# Patient Record
Sex: Male | Born: 1970 | Race: Black or African American | Hispanic: No | Marital: Single | Smoking: Current every day smoker
Health system: Southern US, Community
[De-identification: ages and names within clinical notes are randomized; demographics above are authoritative.]

## PROBLEM LIST (undated history)

## (undated) ENCOUNTER — Ambulatory Visit (HOSPITAL_COMMUNITY): Discharge status: Absent without leave | Payer: Self-pay

## (undated) DIAGNOSIS — I10 Essential (primary) hypertension: Secondary | ICD-10-CM

## (undated) HISTORY — PX: FRACTURE SURGERY: SHX138

---

## 2004-04-14 HISTORY — PX: FRACTURE SURGERY: SHX138

## 2015-08-24 ENCOUNTER — Encounter (HOSPITAL_COMMUNITY): Payer: Self-pay | Admitting: Nurse Practitioner

## 2015-08-24 ENCOUNTER — Emergency Department (HOSPITAL_COMMUNITY): Payer: Self-pay

## 2015-08-24 ENCOUNTER — Emergency Department (HOSPITAL_COMMUNITY)
Admission: EM | Admit: 2015-08-24 | Discharge: 2015-08-24 | Disposition: A | Discharge status: Home / Self Care | Payer: Self-pay | Attending: Emergency Medicine | Admitting: Emergency Medicine

## 2015-08-24 DIAGNOSIS — I10 Essential (primary) hypertension: Secondary | ICD-10-CM | POA: Insufficient documentation

## 2015-08-24 DIAGNOSIS — R531 Weakness: Secondary | ICD-10-CM | POA: Insufficient documentation

## 2015-08-24 DIAGNOSIS — F17210 Nicotine dependence, cigarettes, uncomplicated: Secondary | ICD-10-CM | POA: Insufficient documentation

## 2015-08-24 DIAGNOSIS — R200 Anesthesia of skin: Secondary | ICD-10-CM | POA: Insufficient documentation

## 2015-08-24 DIAGNOSIS — R42 Dizziness and giddiness: Secondary | ICD-10-CM | POA: Insufficient documentation

## 2015-08-24 HISTORY — DX: Essential (primary) hypertension: I10

## 2015-08-24 LAB — COMPREHENSIVE METABOLIC PANEL
ALT: 21 U/L (ref 17–63)
ANION GAP: 10 (ref 5–15)
AST: 21 U/L (ref 15–41)
Albumin: 4 g/dL (ref 3.5–5.0)
Alkaline Phosphatase: 50 U/L (ref 38–126)
BILIRUBIN TOTAL: 1 mg/dL (ref 0.3–1.2)
BUN: 14 mg/dL (ref 6–20)
CALCIUM: 9.6 mg/dL (ref 8.9–10.3)
CO2: 21 mmol/L — AB (ref 22–32)
Chloride: 108 mmol/L (ref 101–111)
Creatinine, Ser: 0.94 mg/dL (ref 0.61–1.24)
GFR calc Af Amer: >60 mL/min (ref >60)
GFR calc non Af Amer: >60 mL/min (ref >60)
GLUCOSE: 100 mg/dL — AB (ref 65–99)
Potassium: 3.9 mmol/L (ref 3.5–5.1)
Sodium: 139 mmol/L (ref 135–145)
TOTAL PROTEIN: 6.9 g/dL (ref 6.5–8.1)

## 2015-08-24 LAB — CBC WITH DIFFERENTIAL/PLATELET
Basophils Absolute: 0 10*3/uL (ref 0.0–0.1)
Basophils Relative: 0 %
EOS PCT: 2 %
Eosinophils Absolute: 0.2 10*3/uL (ref 0.0–0.7)
HCT: 40.4 % (ref 39.0–52.0)
Hemoglobin: 13.9 g/dL (ref 13.0–17.0)
LYMPHS ABS: 3.4 10*3/uL (ref 0.7–4.0)
LYMPHS PCT: 40 %
MCH: 30.4 pg (ref 26.0–34.0)
MCHC: 34.4 g/dL (ref 30.0–36.0)
MCV: 88.4 fL (ref 78.0–100.0)
MONO ABS: 0.7 10*3/uL (ref 0.1–1.0)
Monocytes Relative: 8 %
Neutro Abs: 4.2 10*3/uL (ref 1.7–7.7)
Neutrophils Relative %: 50 %
PLATELETS: 266 10*3/uL (ref 150–400)
RBC: 4.57 MIL/uL (ref 4.22–5.81)
RDW: 12.6 % (ref 11.5–15.5)
WBC: 8.4 10*3/uL (ref 4.0–10.5)

## 2015-08-24 LAB — TROPONIN I: Troponin I: <0.03 ng/mL (ref <0.031)

## 2015-08-24 NOTE — ED Provider Notes (Signed)
CSN: [id number]     Arrival date & time 08/24/15  1626 History   First MD Initiated Contact with Patient 08/24/15 1656     Chief Complaint  Patient presents with  . Dizziness  . Numbness     (Consider location/radiation/quality/duration/timing/severity/associated sxs/prior Treatment) HPI Comments: Patient here complaining of intermittent episodes of becoming dizzy and weak that began yesterday. States that they start spontaneously described as weakness that is all over and nonfocal with numbness that starts in his head and goes down both of his arms. Denies any associated chest pain or shortness of breath. No visual changes. No prior history of same. No treatment used for this. Was concerned that yesterday he was possibly dehydrated. He is unsure how long the symptoms last for things that are likely several hours. Does not take any medications currently  Patient is a 45 y.o. male presenting with dizziness. The history is provided by the patient and the spouse.  Dizziness   Past Medical History  Diagnosis Date  . Hypertension     'when I was little'   Past Surgical History  Procedure Laterality Date  . Fracture surgery Left 2006    Facial fractures with metal plates  . Fracture surgery Left     left ankle with screws placed   Family History  Problem Relation Age of Onset  . Diabetes Mother   . Heart failure Mother   . Heart failure Father    Social History  Substance Use Topics  . Smoking status: Current Every Day Smoker -- 1.00 packs/day for 30 years    Types: Cigarettes  . Smokeless tobacco: None  . Alcohol Use: 3.6 oz/week    6 Cans of beer per week    Review of Systems  Neurological: Positive for dizziness.  All other systems reviewed and are negative.     Allergies  Bee venom  Home Medications   Prior to Admission medications   Not on File   BP 135/89 mmHg  Pulse 69  Temp(Src) 98 F (36.7 C) (Oral)  Ht 5\' 9"  (1.753 m)  Wt 86.183 kg  BMI 28.05 kg/m2   SpO2 98% Physical Exam  Constitutional: He is oriented to person, place, and time. He appears well-developed and well-nourished.  Non-toxic appearance. No distress.  HENT:  Head: Normocephalic and atraumatic.  Eyes: Conjunctivae, EOM and lids are normal. Pupils are equal, round, and reactive to light.  Neck: Normal range of motion. Neck supple. No tracheal deviation present. No thyroid mass present.  Cardiovascular: Normal rate, regular rhythm and normal heart sounds.  Exam reveals no gallop.   No murmur heard. Pulmonary/Chest: Effort normal and breath sounds normal. No stridor. No respiratory distress. He has no decreased breath sounds. He has no wheezes. He has no rhonchi. He has no rales.  Abdominal: Soft. Normal appearance and bowel sounds are normal. He exhibits no distension. There is no tenderness. There is no rebound and no CVA tenderness.  Musculoskeletal: Normal range of motion. He exhibits no edema or tenderness.  Neurological: He is alert and oriented to person, place, and time. He has normal strength. No cranial nerve deficit or sensory deficit. GCS eye subscore is 4. GCS verbal subscore is 5. GCS motor subscore is 6.  Skin: Skin is warm and dry. No abrasion and no rash noted.  Psychiatric: He has a normal mood and affect. His speech is normal and behavior is normal.  Nursing note and vitals reviewed.   ED Course  Procedures (including critical  care time) Labs Review Labs Reviewed  CBC WITH DIFFERENTIAL/PLATELET  COMPREHENSIVE METABOLIC PANEL  TROPONIN I    Imaging Review No results found. I have personally reviewed and evaluated these images and lab results as part of my medical decision-making.   EKG Interpretation None      MDM   Final diagnoses:  None    ED ECG REPORT   Date: 08/24/2015  Rate: 59  Rhythm: normal sinus rhythm  QRS Axis: normal  Intervals: normal  ST/T Wave abnormalities: normal  Conduction Disutrbances:none  Narrative  Interpretation:   Old EKG Reviewed: none available  I have personally reviewed the EKG tracing and agree with the computerized printout as noted.  Patient is not orthostatic here. Neurological exam remains stable. Labs and head CT are reassuring. Will be given neurology follow-up   Lorre Nick, MD 08/24/15 2005

## 2015-08-24 NOTE — Discharge Instructions (Signed)

## 2015-08-24 NOTE — Progress Notes (Signed)
EDCM spoke to patient at bedside. Patient confirms she does [place] have a pcp [place]  [facility] informed patient of services there.  EDCM also provided patient with list of pcps who accept self pay patients, list of discount pharmacies and websites needymeds.org and GoodRX.com for medication assistance, phone number to inquire about the orange card, phone number to inquire about Mediciad, phone number to inquire about the Affordable Care Act, financial resources in the community such as local churches, salvation [employer], urban ministries, and dental assistance for uninsured patients.   No further EDCM needs at this time.  EDPA at bedside.  Resources left at bedside.

## 2015-08-24 NOTE — ED Notes (Signed)
Reviewed d/c instructions, patient verbalizes understanding of same. A&O x4 upon d/c, steady gait, in NAD, with male companion. Patient also given work note.

## 2015-08-24 NOTE — ED Notes (Signed)
Patient presents today for complaints of dizziness and weakness that began yesterday. He felt he was dehydrated, tried taking it easy, and when symptoms did not improve he became concerned. His symptoms persisted and he became concerned so came to ED accompanied by his fiance. He endorses difficulty walking, says he sees spots.

## 2016-05-25 DIAGNOSIS — J111 Influenza due to unidentified influenza virus with other respiratory manifestations: Secondary | ICD-10-CM | POA: Insufficient documentation

## 2016-05-25 DIAGNOSIS — F1290 Cannabis use, unspecified, uncomplicated: Secondary | ICD-10-CM | POA: Insufficient documentation

## 2016-05-25 DIAGNOSIS — F17210 Nicotine dependence, cigarettes, uncomplicated: Secondary | ICD-10-CM | POA: Insufficient documentation

## 2016-05-25 DIAGNOSIS — I10 Essential (primary) hypertension: Secondary | ICD-10-CM | POA: Insufficient documentation

## 2016-05-25 NOTE — ED Notes (Signed)
Pt presents to stat registration with c/o flu-like symptoms since Thursday; believes he has the flu; ambulatory with steady gait; talking in complete coherent sentences

## 2016-05-25 NOTE — ED Triage Notes (Signed)
Reports body aches and fever since Thursday.

## 2016-05-26 ENCOUNTER — Emergency Department: Payer: Self-pay

## 2016-05-26 ENCOUNTER — Emergency Department
Admission: EM | Admit: 2016-05-26 | Discharge: 2016-05-26 | Disposition: A | Discharge status: Home / Self Care | Payer: Self-pay | Attending: Emergency Medicine | Admitting: Emergency Medicine

## 2016-05-26 DIAGNOSIS — J111 Influenza due to unidentified influenza virus with other respiratory manifestations: Secondary | ICD-10-CM

## 2016-05-26 MED ORDER — BENZONATATE 100 MG PO CAPS: 100.0000 mg | ORAL_CAPSULE | Freq: Four times a day (QID) | ORAL | 0 refills | Status: DC | PRN

## 2016-05-26 MED ORDER — ACETAMINOPHEN 500 MG PO TABS
1000.0000 mg | ORAL_TABLET | Freq: Once | ORAL | Status: AC
  Administered 2016-05-26: 1000 mg via ORAL
  Filled 2016-05-26: qty 2

## 2016-05-26 MED ORDER — IBUPROFEN 600 MG PO TABS
600.0000 mg | ORAL_TABLET | Freq: Once | ORAL | Status: AC
  Administered 2016-05-26: 600 mg via ORAL
  Filled 2016-05-26: qty 1

## 2016-05-26 MED ORDER — BENZONATATE 100 MG PO CAPS
100.0000 mg | ORAL_CAPSULE | Freq: Once | ORAL | Status: AC
  Administered 2016-05-26: 100 mg via ORAL
  Filled 2016-05-26: qty 1

## 2016-05-26 NOTE — ED Provider Notes (Signed)
[facility] Emergency Department Provider Note   ____________________________________________   First MD Initiated Contact with Patient 05/26/16 [phone]     (approximate)  I have reviewed the triage vital signs and the nursing notes.   HISTORY  Chief Complaint Influenza   HPI Walter Collins is a 46 y.o. male who comes into the hospital today and thinks he has the flu. He reports that he has been waking up in a pool of sweat with chills and body aches. He reports that the chills or so bad it hurts. He's had a cough and some sore throat. The patient thinks he's had a fever but he is unsure how high because he has not checked his temperature. He's been taking Robitussin and cough drops as well as some soup and water. The patient reports that the symptoms started on Thursday. It was worse Friday night. He has no known sick contacts but reports that there have been people were sick at work. The patient has had some diarrhea but no vomiting. He reports that when he coughs he does feel little short of breath. The patient is here tonight for evaluation.Patient rates his pain a 5 out of 10 in intensity.   Past Medical History:  Diagnosis Date  . Hypertension    'when I was little'    There are no active problems to display for this patient.   Past Surgical History:  Procedure Laterality Date  . FRACTURE SURGERY Left 2006   Facial fractures with metal plates  . FRACTURE SURGERY Left    left ankle with screws placed    Prior to Admission medications   Medication Sig Start Date End Date Taking? Authorizing Provider  benzonatate (TESSALON PERLES) 100 MG capsule Take 1 capsule (100 mg total) by mouth every 6 (six) hours as needed for cough. 05/26/16   Rebecka Apley, MD    Allergies Bee venom  Family History  Problem Relation Age of Onset  . Diabetes Mother   . Heart failure Mother   . Heart failure Father     Social History Social History  Substance  Use Topics  . Smoking status: Current Every Day Smoker    Packs/day: 1.00    Years: 30.00    Types: Cigarettes  . Smokeless tobacco: Not on file  . Alcohol use 3.6 oz/week    6 Cans of beer per week    Review of Systems Constitutional:  fever/chills Eyes: No visual changes. ENT: sore throat. Cardiovascular: Denies chest pain. Respiratory: Cough and shortness of breath. Gastrointestinal: No abdominal pain.  No nausea, no vomiting.  No diarrhea.  No constipation. Genitourinary: Negative for dysuria. Musculoskeletal: Body aches Skin: Negative for rash. Neurological: Negative for headaches, focal weakness or numbness.  10-point ROS otherwise negative.  ____________________________________________   PHYSICAL EXAM:  VITAL SIGNS: ED Triage Vitals  Enc Vitals Group     BP 05/25/16 2137 (!) 129/95     Pulse Rate 05/25/16 2137 (!) 101     Resp 05/25/16 2137 20     Temp 05/25/16 2137 100 F (37.8 C)     Temp Source 05/25/16 2137 Oral     SpO2 05/25/16 2137 95 %     Weight 05/25/16 2136 180 lb (81.6 kg)     Height 05/25/16 2136 5\' 8"  (1.727 m)     Head Circumference --      Peak Flow --      Pain Score 05/25/16 2137 5     Pain  Loc --      Pain Edu? --      Excl. in GC? --     Constitutional: Alert and oriented. Well appearing and in Moderate distress. Eyes: Conjunctivae are normal. PERRL. EOMI. Head: Atraumatic. Nose: No congestion/rhinnorhea. Mouth/Throat: Mucous membranes are moist.  Oropharynx non-erythematous. Cardiovascular: Normal rate, regular rhythm. Grossly normal heart sounds.  Good peripheral circulation. Respiratory: Normal respiratory effort.  No retractions. Lungs CTAB. Gastrointestinal: Soft and nontender. No distention. Positive bowel sounds Musculoskeletal: No lower extremity tenderness nor edema.   Neurologic:  Normal speech and language.  Skin:  Skin is warm, dry and intact.  Psychiatric: Mood and affect are normal.    ____________________________________________   LABS (all labs ordered are listed, but only abnormal results are displayed)  Labs Reviewed - No data to display ____________________________________________  EKG  none ____________________________________________  RADIOLOGY  CXR ____________________________________________   PROCEDURES  Procedure(s) performed: None  Procedures  Critical Care performed: No  ____________________________________________   INITIAL IMPRESSION / ASSESSMENT AND PLAN / ED COURSE  Pertinent labs & imaging results that were available during my care of the patient were reviewed by me and considered in my medical decision making (see chart for details).  This is a 46 year old male who comes into the hospital today with flulike symptoms. The patient's symptoms do seem very consistent with the flu. I did send the patient for chest x-ray as he has a cough with some shortness of breath. I did give the patient as well some benzonatate as well as ibuprofen and Tylenol. The patient's chest x-ray does not show a pneumonia. I informed the patient that we would not do a flu swab as there is a Sport and exercise psychologistnational shortage flu swabs at this time but his symptoms are consistent with flu. He is outside the window for Tamiflu at this time. He needs supportive care at home. I think this with the patient and has no further questions or concerns. He'll be discharged home.  Clinical Course as of May 27 155  Mon May 26, 2016  0124 No active cardiopulmonary disease. DG Chest 2 View [AW]    Clinical Course User Index [AW] Rebecka ApleyAllison P Webster, MD     ____________________________________________   FINAL CLINICAL IMPRESSION(S) / ED DIAGNOSES  Final diagnoses:  Influenza      NEW MEDICATIONS STARTED DURING THIS VISIT:  New Prescriptions   BENZONATATE (TESSALON PERLES) 100 MG CAPSULE    Take 1 capsule (100 mg total) by mouth every 6 (six) hours as needed for cough.      Note:  This document was prepared using Dragon voice recognition software and may include unintentional dictation errors.    Rebecka ApleyAllison P Webster, MD 05/26/16 0157

## 2016-05-26 NOTE — Discharge Instructions (Signed)
Although we did not perform a flu swab your symptoms are consistent with the flu virus. Please ensure that she were taking Tylenol 1000 mg every 6 hours as well as ibuprofen 600 mg every 6 hours. Please ensure that she were also drinking to remain hydrated and rest at home. Please follow-up with your primary care physician or the walk-in clinic for further evaluation. Please return with any worsening symptoms, worsening shortness of breath or any other concerns.

## 2017-01-21 ENCOUNTER — Emergency Department (HOSPITAL_COMMUNITY)
Admission: EM | Admit: 2017-01-21 | Discharge: 2017-01-21 | Disposition: A | Discharge status: Home / Self Care | Payer: BLUE CROSS/BLUE SHIELD | Attending: Emergency Medicine | Admitting: Emergency Medicine

## 2017-01-21 ENCOUNTER — Encounter (HOSPITAL_COMMUNITY): Payer: Self-pay

## 2017-01-21 DIAGNOSIS — H1033 Unspecified acute conjunctivitis, bilateral: Secondary | ICD-10-CM | POA: Diagnosis not present

## 2017-01-21 DIAGNOSIS — F17210 Nicotine dependence, cigarettes, uncomplicated: Secondary | ICD-10-CM | POA: Insufficient documentation

## 2017-01-21 DIAGNOSIS — I10 Essential (primary) hypertension: Secondary | ICD-10-CM | POA: Diagnosis not present

## 2017-01-21 DIAGNOSIS — Z79899 Other long term (current) drug therapy: Secondary | ICD-10-CM | POA: Diagnosis not present

## 2017-01-21 DIAGNOSIS — R55 Syncope and collapse: Secondary | ICD-10-CM | POA: Diagnosis not present

## 2017-01-21 LAB — BASIC METABOLIC PANEL
ANION GAP: 7 (ref 5–15)
BUN: 11 mg/dL (ref 6–20)
CALCIUM: 9.3 mg/dL (ref 8.9–10.3)
CHLORIDE: 106 mmol/L (ref 101–111)
CO2: 26 mmol/L (ref 22–32)
Creatinine, Ser: 1.16 mg/dL (ref 0.61–1.24)
GFR calc non Af Amer: >60 mL/min (ref >60)
Glucose, Bld: 92 mg/dL (ref 65–99)
Potassium: 4.1 mmol/L (ref 3.5–5.1)
Sodium: 139 mmol/L (ref 135–145)

## 2017-01-21 LAB — CBC
HEMATOCRIT: 44.3 % (ref 39.0–52.0)
HEMOGLOBIN: 14.8 g/dL (ref 13.0–17.0)
MCH: 30.5 pg (ref 26.0–34.0)
MCHC: 33.4 g/dL (ref 30.0–36.0)
MCV: 91.2 fL (ref 78.0–100.0)
Platelets: 254 10*3/uL (ref 150–400)
RBC: 4.86 MIL/uL (ref 4.22–5.81)
RDW: 13 % (ref 11.5–15.5)
WBC: 9.5 10*3/uL (ref 4.0–10.5)

## 2017-01-21 MED ORDER — TOBRAMYCIN 0.3 % OP SOLN
2.0000 [drp] | Freq: Once | OPHTHALMIC | Status: AC
  Administered 2017-01-21: 2 [drp] via OPHTHALMIC
  Filled 2017-01-21: qty 5

## 2017-01-21 NOTE — ED Triage Notes (Signed)
Pt comes from work via [facility] [facility] had near syncopal episode with dizziness.

## 2017-01-21 NOTE — ED Provider Notes (Addendum)
[facility]-EMERGENCY DEPT Provider Note   CSN: [id number] Arrival date & time: 01/21/17  2016     History   Chief Complaint Chief Complaint  Patient presents with  . Near Syncope    HPI Calel Pisarski is a 46 y.o. male.  Patient c/o feeling lightheaded at work this evening. Denies syncope. No trauma or fall. Denies any current or recent chest pain or discomfort. No sob or unusual doe. Denies headache. No palpitations. No abd pain. No nvd. No dysuria or gu c/o. No recent blood loss, rectal bleeding or melena. States in past 2 days felt as if may have cold, w nasal congestion/rhinorrhea - took zyrtec today. Also with bil eye redness, matting on lashes. No known ill contacts. No eye pain or change in vision. Does not wear contact. No other medication use. Denies cough or sore throat. No fever or chills.    The history is provided by the patient.  Near Syncope  Pertinent negatives include no chest pain, no abdominal pain, no headaches and no shortness of breath.    Past Medical History:  Diagnosis Date  . Hypertension    'when I was little'    There are no active problems to display for this patient.   Past Surgical History:  Procedure Laterality Date  . FRACTURE SURGERY Left 2006   Facial fractures with metal plates  . FRACTURE SURGERY Left    left ankle with screws placed       Home Medications    Prior to Admission medications   Medication Sig Start Date End Date Taking? Authorizing Provider  benzonatate (TESSALON PERLES) 100 MG capsule Take 1 capsule (100 mg total) by mouth every 6 (six) hours as needed for cough. 05/26/16   Rebecka Apley, MD    Family History Family History  Problem Relation Age of Onset  . Diabetes Mother   . Heart failure Mother   . Heart failure Father     Social History Social History  Substance Use Topics  . Smoking status: Current Every Day Smoker    Packs/day: 1.00    Years: 30.00    Types: Cigarettes  . Smokeless tobacco: Not  on file  . Alcohol use 3.6 oz/week    6 Cans of beer per week     Allergies   Bee venom   Review of Systems Review of Systems  Constitutional: Negative for chills and fever.  HENT: Positive for congestion and rhinorrhea. Negative for sore throat.   Eyes: Positive for discharge and redness. Negative for visual disturbance.  Respiratory: Negative for cough and shortness of breath.   Cardiovascular: Positive for near-syncope. Negative for chest pain, palpitations and leg swelling.  Gastrointestinal: Negative for abdominal pain.  Genitourinary: Negative for flank pain.  Musculoskeletal: Negative for back pain and neck pain.  Skin: Negative for rash.  Neurological: Negative for weakness, numbness and headaches.  Hematological: Does not bruise/bleed easily.  Psychiatric/Behavioral: Negative for confusion.     Physical Exam Updated Vital Signs BP (!) 129/92   Pulse 70   Temp 98 F (36.7 C)   Resp 20   SpO2 100%   Physical Exam  Constitutional: He is oriented to person, place, and time. He appears well-developed and well-nourished. No distress.  HENT:  Mouth/Throat: Oropharynx is clear and moist.  Nasal congestion  Eyes: Pupils are equal, round, and reactive to light. Right eye exhibits discharge. Left eye exhibits discharge.  Conjunctivitis. No orbital or periorbital cellulitis.   Neck: Neck supple. No  tracheal deviation present.  No stiffness/rigidity.   Cardiovascular: Normal rate, regular rhythm, normal heart sounds and intact distal pulses.  Exam reveals no gallop and no friction rub.   No murmur heard. Pulmonary/Chest: Effort normal and breath sounds normal. No accessory muscle usage. No respiratory distress.  Abdominal: Soft. Bowel sounds are normal. He exhibits no distension. There is no tenderness.  Musculoskeletal: He exhibits no edema or tenderness.  Neurological: He is alert and oriented to person, place, and time.  Speech normal. Steady gait.   Skin: Skin is  warm and dry. He is not diaphoretic.  Psychiatric: He has a normal mood and affect.  Nursing note and vitals reviewed.    ED Treatments / Results  Labs (all labs ordered are listed, but only abnormal results are displayed) Results for orders placed or performed during the hospital encounter of 01/21/17  CBC  Result Value Ref Range   WBC 9.5 4.0 - 10.5 K/uL   RBC 4.86 4.22 - 5.81 MIL/uL   Hemoglobin 14.8 13.0 - 17.0 g/dL   HCT 16.1 09.6 - 04.5 %   MCV 91.2 78.0 - 100.0 fL   MCH 30.5 26.0 - 34.0 pg   MCHC 33.4 30.0 - 36.0 g/dL   RDW 40.9 81.1 - 91.4 %   Platelets 254 150 - 400 K/uL  Basic metabolic panel  Result Value Ref Range   Sodium 139 135 - 145 mmol/L   Potassium 4.1 3.5 - 5.1 mmol/L   Chloride 106 101 - 111 mmol/L   CO2 26 22 - 32 mmol/L   Glucose, Bld 92 65 - 99 mg/dL   BUN 11 6 - 20 mg/dL   Creatinine, Ser 7.82 0.61 - 1.24 mg/dL   Calcium 9.3 8.9 - 95.6 mg/dL   GFR calc non Af Amer >60 >60 mL/min   GFR calc Af Amer >60 >60 mL/min   Anion gap 7 5 - 15   EKG  EKG Interpretation  Date/Time:  Wednesday January 21 2017 20:34:43 EDT Ventricular Rate:  59 PR Interval:    QRS Duration: 105 QT Interval:  442 QTC Calculation: 438 R Axis:   21 Text Interpretation:  Sinus rhythm No significant change since last tracing Confirmed by Cathren Laine ([id number]) on 01/21/2017 9:28:35 PM       Radiology No results found.  Procedures Procedures (including critical care time)  Medications Ordered in ED Medications - No data to display   Initial Impression / Assessment and Plan / ED Course  I have reviewed the triage vital signs and the nursing notes.  Pertinent labs & imaging results that were available during my care of the patient were reviewed by me and considered in my medical decision making (see chart for details).  Po fluids. Snack.  Labs.  Reviewed nursing notes and prior charts for additional history.   Ambulates in ED, no faintness or dizziness.   In  sinus rhythm on monitor.  tobrex eye drops re conjunctivitis.   Pt remains asymptomatic and appears stable for d/c.     Final Clinical Impressions(s) / ED Diagnoses   Final diagnoses:  None    New Prescriptions New Prescriptions   No medications on file       Cathren Laine, MD 01/21/17 2251

## 2017-01-21 NOTE — Discharge Instructions (Addendum)
It was our pleasure to provide your ER care today - we hope that you feel better.  Rest. Drink plenty of fluids.   Use tobrex eye drops 1-2 drops in each eye 4x/day, for the next 5 days.   Follow up with primary care doctor in the next 1-2 days if symptoms fail to improve/resolve.  Your blood pressure is mildly high tonight - follow up with primary  care doctor in 1 week.   Return to ER if worse, new symptoms, fevers, weak/fainting, pain, trouble breathing, other concern.

## 2017-07-28 IMAGING — CR DG CHEST 2V
2 series · 2 of 2 positions shown · non-contrast
Comparison: None.

CLINICAL DATA: Cough and chills

EXAM:
CHEST  2 VIEW

[chest pa]
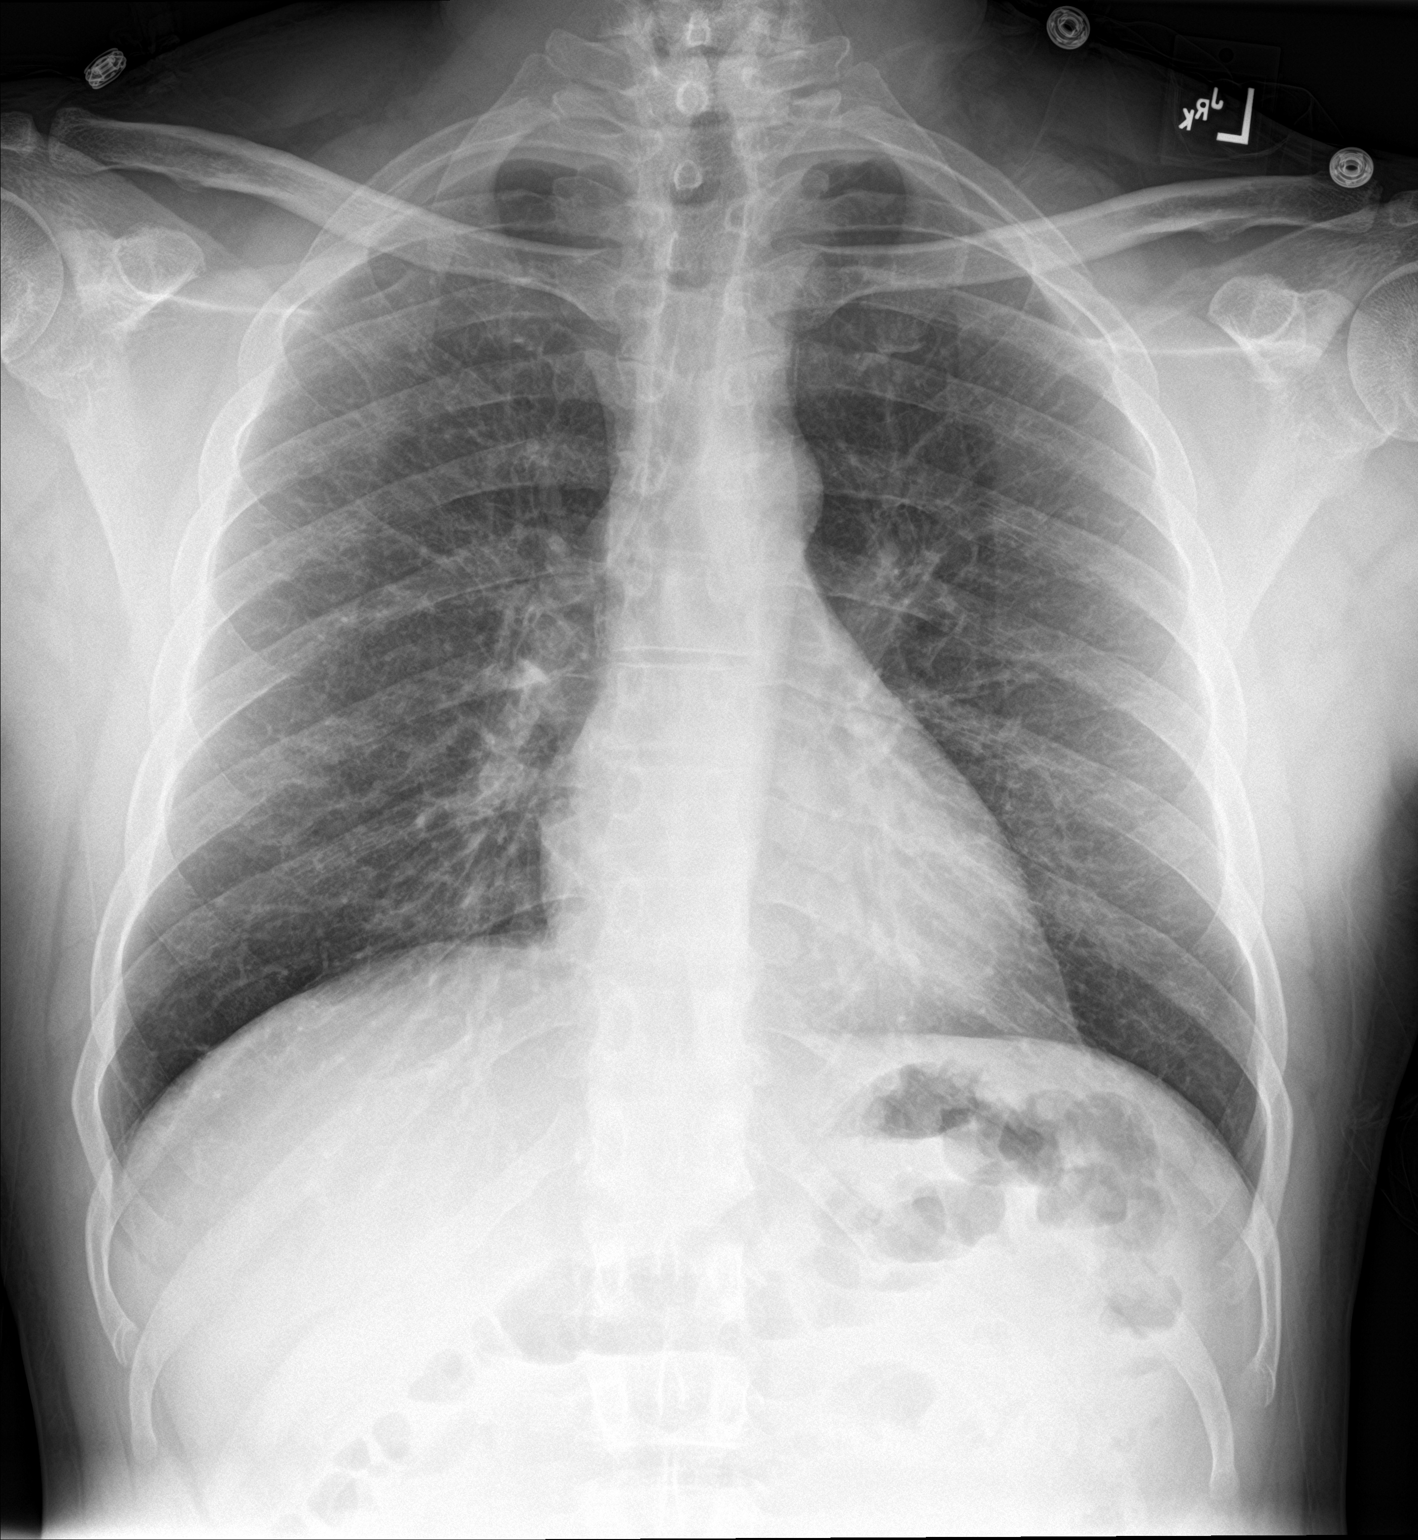

[chest lat]
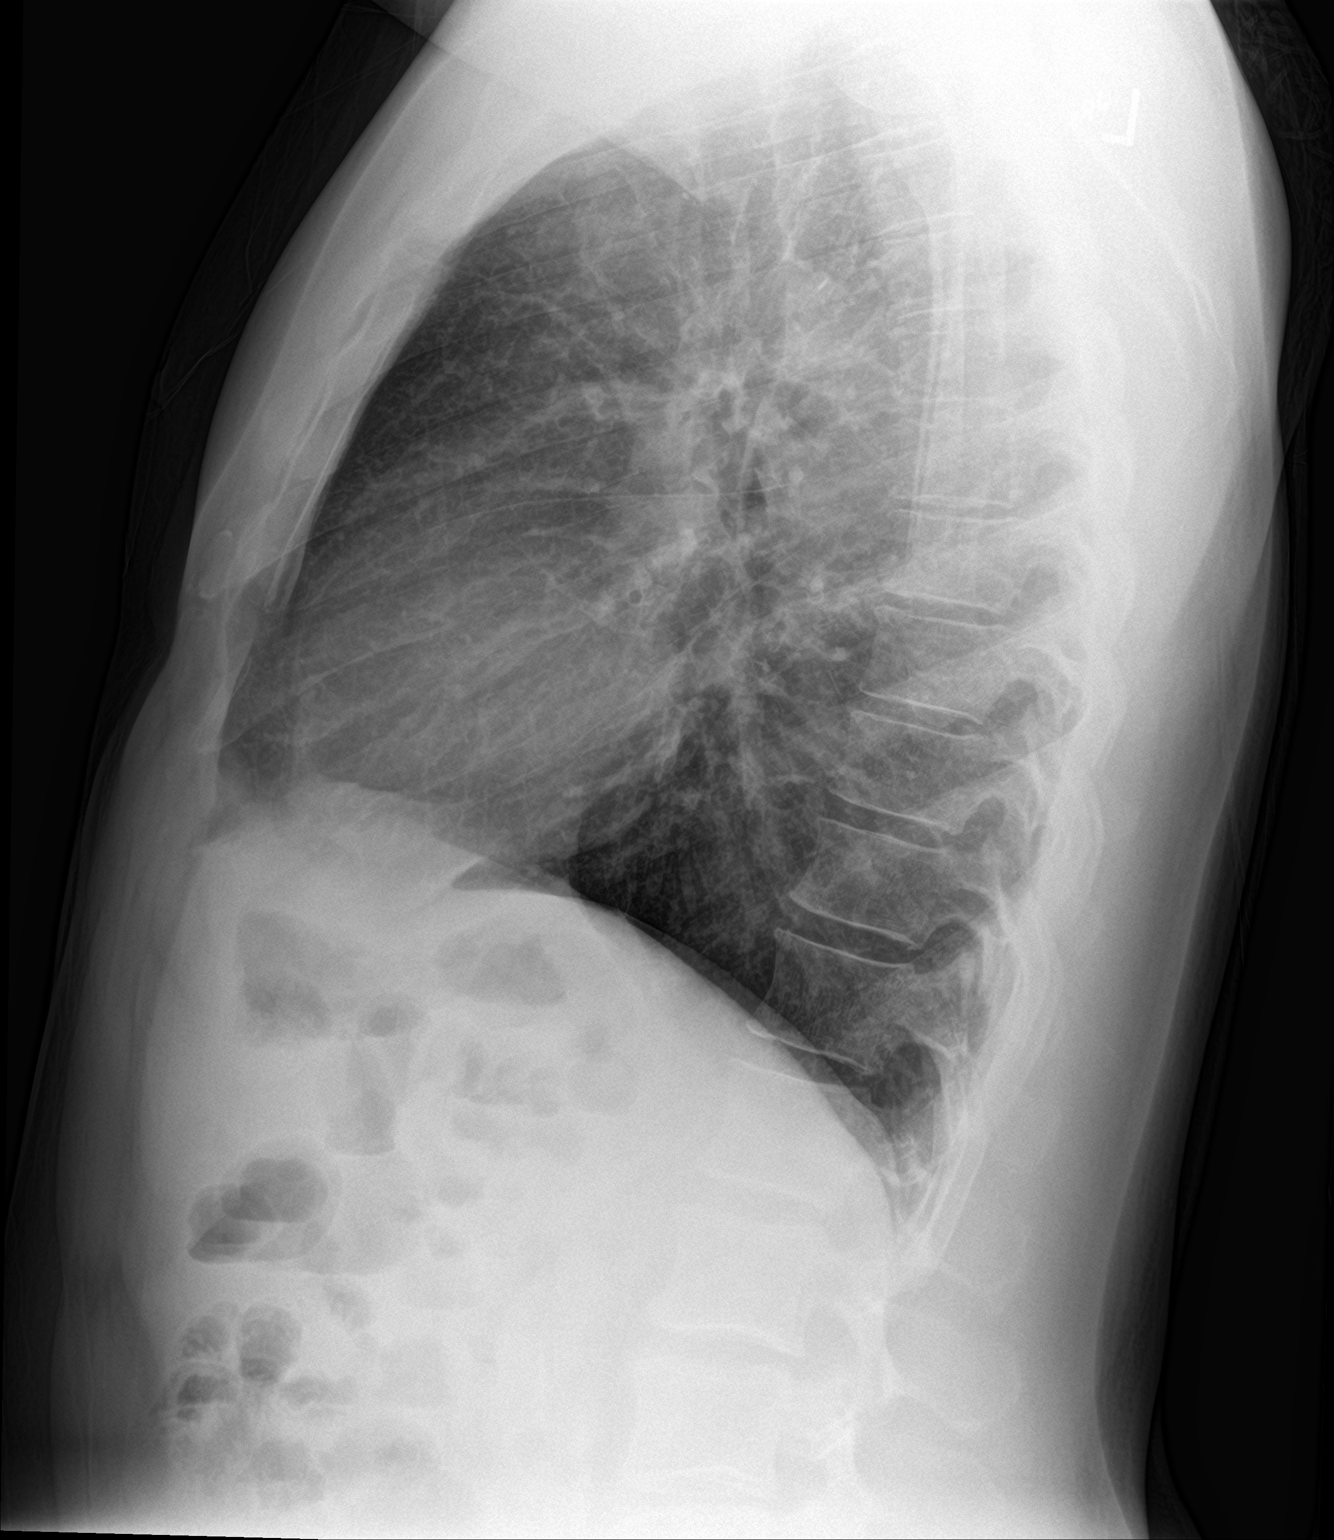

[2 of 2 positions shown; findings below may reference images not displayed]

FINDINGS: The heart size and mediastinal contours are within normal limits.
Both lungs are clear. The visualized skeletal structures are
unremarkable.
IMPRESSION: No active cardiopulmonary disease.

## 2019-10-27 ENCOUNTER — Other Ambulatory Visit: Payer: Self-pay

## 2019-10-27 ENCOUNTER — Ambulatory Visit
Admission: EM | Admit: 2019-10-27 | Discharge: 2019-10-27 | Disposition: A | Discharge status: Home / Self Care | Payer: BLUE CROSS/BLUE SHIELD | Attending: Physician Assistant | Admitting: Physician Assistant

## 2019-10-27 DIAGNOSIS — R252 Cramp and spasm: Secondary | ICD-10-CM

## 2019-10-27 DIAGNOSIS — F1010 Alcohol abuse, uncomplicated: Secondary | ICD-10-CM

## 2019-10-27 DIAGNOSIS — J392 Other diseases of pharynx: Secondary | ICD-10-CM

## 2019-10-27 NOTE — ED Triage Notes (Signed)
Pt muscle cramps x several days, states woke up last night with a "Charlie horse," reports cramping all over his body x 6 months  Pt c/o right shoulder pain, worse with lifting arm, unknown injury, pain x 6 months   Pt c/o "my throat doesn't feel right, it feels uncomfortable" x 6 months.

## 2019-10-27 NOTE — ED Provider Notes (Signed)
[facility]    CSN: [id number] Arrival date & time: 10/27/19  1123      History   Chief Complaint Chief Complaint  Patient presents with  . Muscle Pain  . Sore Throat  . Shoulder Pain    HPI Chrstopher Malenfant is a 49 y.o. male.   49 year old male come in for multiple chronic problems. States has had 6 month history of muscle cramps, throat irritation. States muscle cramps worsening and was what brought him in for evaluation. Muscle cramps happens in different areas without obvious aggravating or alleviating factor. States he could turn his body and cause cramping. Denies nausea/vomiting. States at baseline with soft/runny stools and frequent stools, no significant changes. Denies melena, hematochezia, abdominal pain. Denies excessive sweating. Daily EtOH use, 80 oz of beer daily. Occasional liquor use. Current every day smoker, 30+ pack year history. THC use. No chronic NSAID use. Denies unintentional weight loss, night sweats.      Past Medical History:  Diagnosis Date  . Hypertension    'when I was little'    There are no problems to display for this patient.   Past Surgical History:  Procedure Laterality Date  . FRACTURE SURGERY Left 2006   Facial fractures with metal plates  . FRACTURE SURGERY Left    left ankle with screws placed       Home Medications    Prior to Admission medications   Not on File    Family History Family History  Problem Relation Age of Onset  . Diabetes Mother   . Heart failure Mother   . Heart failure Father     Social History Social History   Tobacco Use  . Smoking status: Current Every Day Smoker    Packs/day: 1.00    Years: 30.00    Pack years: 30.00    Types: Cigarettes  Substance Use Topics  . Alcohol use: Yes    Alcohol/week: 6.0 standard drinks    Types: 6 Cans of beer per week  . Drug use: Yes    Frequency: 2.0 times per week    Types: Marijuana     Allergies   Bee venom   Review of  Systems Review of Systems  Reason unable to perform ROS: See HPI as above.     Physical Exam Triage Vital Signs ED Triage Vitals [10/27/19 1135]  Enc Vitals Group     BP (!) 121/57     Pulse Rate 88     Resp 16     Temp 98.4 F (36.9 C)     Temp src      SpO2 96 %     Weight      Height      Head Circumference      Peak Flow      Pain Score      Pain Loc      Pain Edu?      Excl. in GC?    No data found.  Updated Vital Signs BP (!) 121/57   Pulse 88   Temp 98.4 F (36.9 C)   Resp 16   SpO2 96%   Physical Exam Constitutional:      General: He is not in acute distress.    Appearance: Normal appearance. He is not ill-appearing, toxic-appearing or diaphoretic.  HENT:     Head: Normocephalic and atraumatic.     Mouth/Throat:     Mouth: Mucous membranes are moist.     Pharynx: Oropharynx  is clear. Uvula midline.  Cardiovascular:     Rate and Rhythm: Normal rate and regular rhythm.  Pulmonary:     Effort: Pulmonary effort is normal. No respiratory distress.     Comments: LCTAB Abdominal:     General: Bowel sounds are normal.     Palpations: Abdomen is soft.     Tenderness: There is no abdominal tenderness. There is no right CVA tenderness, left CVA tenderness, guarding or rebound.  Musculoskeletal:     Cervical back: Normal range of motion and neck supple.     Comments: Moving extremities freely without problems.   Skin:    General: Skin is warm and dry.  Neurological:     Mental Status: He is alert and oriented to person, place, and time.      UC Treatments / Results  Labs (all labs ordered are listed, but only abnormal results are displayed) Labs Reviewed  CBC WITH DIFFERENTIAL/PLATELET  COMPREHENSIVE METABOLIC PANEL  VITAMIN B12    EKG   Radiology No results found.  Procedures Procedures (including critical care time)  Medications Ordered in UC Medications - No data to display  Initial Impression / Assessment and Plan / UC Course  I  have reviewed the triage vital signs and the nursing notes.  Pertinent labs & imaging results that were available during my care of the patient were reviewed by me and considered in my medical decision making (see chart for details).    Discussed due to chronic nature of symptoms, will need to follow up with PCP for further workup. However, given daily EtOH use with current symptoms, worries for electrolyte imbalance. Also question vitamin B deficiency causing symptoms. CBC, CMP, vit B12 drawn for further evaluation. Discussed PPI/H2 blocker use for possible GERD causing throat irritation. Resources provided for PCP/GI for further evaluation.  Final Clinical Impressions(s) / UC Diagnoses   Final diagnoses:  Muscle cramp  Throat irritation  Alcohol abuse    ED Prescriptions    None     PDMP not reviewed this encounter.   Belinda Fisher, PA-C 10/27/19 1223

## 2019-10-27 NOTE — Discharge Instructions (Addendum)
No alarming signs today. As discussed, will need to follow up with PCP/GI doctor for further evaluation. We drew labs to check your red blood cell counts, electrolytes, vitamin B12 to see if these are causing your symptoms. Start over the counter acid reflux medicine (omeprazole, pepcid) to see if this helps with throat symptoms. If sudden worsening of symptoms, abdominal pain, fever, vomiting blood, blood in stool, go to the ED for further evaluation.

## 2019-10-28 LAB — CBC WITH DIFFERENTIAL/PLATELET
Basophils Absolute: 0 10*3/uL (ref 0.0–0.2)
Basos: 0 %
EOS (ABSOLUTE): 0.1 10*3/uL (ref 0.0–0.4)
Eos: 1 %
Hematocrit: 46.2 % (ref 37.5–51.0)
Hemoglobin: 15.7 g/dL (ref 13.0–17.7)
Immature Grans (Abs): 0.1 10*3/uL (ref 0.0–0.1)
Immature Granulocytes: 1 %
Lymphocytes Absolute: 3.2 10*3/uL — ABNORMAL HIGH (ref 0.7–3.1)
Lymphs: 33 %
MCH: 31.5 pg (ref 26.6–33.0)
MCHC: 34 g/dL (ref 31.5–35.7)
MCV: 93 fL (ref 79–97)
Monocytes Absolute: 0.7 10*3/uL (ref 0.1–0.9)
Monocytes: 7 %
Neutrophils Absolute: 5.5 10*3/uL (ref 1.4–7.0)
Neutrophils: 58 %
Platelets: 260 10*3/uL (ref 150–450)
RBC: 4.99 x10E6/uL (ref 4.14–5.80)
RDW: 12.8 % (ref 11.6–15.4)
WBC: 9.6 10*3/uL (ref 3.4–10.8)

## 2019-10-28 LAB — COMPREHENSIVE METABOLIC PANEL
ALT: 48 IU/L — ABNORMAL HIGH (ref 0–44)
AST: 32 IU/L (ref 0–40)
Albumin/Globulin Ratio: 1.9 (ref 1.2–2.2)
Albumin: 5 g/dL (ref 4.0–5.0)
Alkaline Phosphatase: 81 IU/L (ref 48–121)
BUN/Creatinine Ratio: 10 (ref 9–20)
BUN: 11 mg/dL (ref 6–24)
Bilirubin Total: 0.6 mg/dL (ref 0.0–1.2)
CO2: 23 mmol/L (ref 20–29)
Calcium: 10 mg/dL (ref 8.7–10.2)
Chloride: 103 mmol/L (ref 96–106)
Creatinine, Ser: 1.05 mg/dL (ref 0.76–1.27)
GFR calc Af Amer: 95 mL/min/{1.73_m2} (ref >59)
GFR calc non Af Amer: 82 mL/min/{1.73_m2} (ref >59)
Globulin, Total: 2.7 g/dL (ref 1.5–4.5)
Glucose: 101 mg/dL — ABNORMAL HIGH (ref 65–99)
Potassium: 4.2 mmol/L (ref 3.5–5.2)
Sodium: 140 mmol/L (ref 134–144)
Total Protein: 7.7 g/dL (ref 6.0–8.5)

## 2019-10-28 LAB — VITAMIN B12: Vitamin B-12: 642 pg/mL (ref 232–1245)

## 2020-10-30 ENCOUNTER — Ambulatory Visit (HOSPITAL_COMMUNITY)
Admission: EM | Admit: 2020-10-30 | Discharge: 2020-10-30 | Disposition: A | Discharge status: Home / Self Care | Payer: Self-pay | Attending: Emergency Medicine | Admitting: Emergency Medicine

## 2020-10-30 ENCOUNTER — Other Ambulatory Visit: Payer: Self-pay

## 2020-10-30 ENCOUNTER — Encounter (HOSPITAL_COMMUNITY): Payer: Self-pay

## 2020-10-30 DIAGNOSIS — N23 Unspecified renal colic: Secondary | ICD-10-CM

## 2020-10-30 LAB — POCT URINALYSIS DIPSTICK, ED / UC
Bilirubin Urine: NEGATIVE
Glucose, UA: NEGATIVE mg/dL
Ketones, ur: NEGATIVE mg/dL
Leukocytes,Ua: NEGATIVE
Nitrite: NEGATIVE
Protein, ur: 100 mg/dL — AB
Specific Gravity, Urine: >=1.03 (ref 1.005–1.030)
Urobilinogen, UA: 0.2 mg/dL (ref 0.0–1.0)
pH: 5.5 (ref 5.0–8.0)

## 2020-10-30 MED ORDER — IBUPROFEN 800 MG PO TABS: 800.0000 mg | ORAL_TABLET | Freq: Three times a day (TID) | ORAL | 0 refills | Status: DC

## 2020-10-30 MED ORDER — TAMSULOSIN HCL 0.4 MG PO CAPS: 0.4000 mg | ORAL_CAPSULE | Freq: Every day | ORAL | 0 refills | Status: AC

## 2020-10-30 NOTE — Discharge Instructions (Addendum)
Take the Flomax daily and drink plenty of water.   You can take the Ibuprofen three times a day as needed for pain.  You can also take Tylenol as needed.    If your pain resolves or you notice that you have passed a kidney stone, you can stop the Flomax.   You can follow up with urology for further evaluation if your symptoms do not improve.   Go to the ED for further evaluation if you develop any worsening pain, bright red blood in your urine, or are unable to urinate.

## 2020-10-30 NOTE — ED Provider Notes (Signed)
[facility]    CSN: [id number] Arrival date & time: 10/30/20  5868      History   Chief Complaint Chief Complaint  Patient presents with   Flank Pain   Back Pain    HPI Walter Collins is a 50 y.o. male.   Patient here for evaluation of left sided back pain that started yesterday.  Reports pain is a throbbing pain and is worse with movements.  Denies similar pain in the past.  Denies any dysuria, urgency, or frequency.  Has not taken any OTC medication or treatments.  Denies any trauma, injury, or other precipitating event.  Denies any fevers, chest pain, shortness of breath, N/V/D, numbness, tingling, weakness, abdominal pain, or headaches.     The history is provided by the patient.  Flank Pain  Back Pain Associated symptoms: no dysuria    Past Medical History:  Diagnosis Date   Hypertension    'when I was little'    There are no problems to display for this patient.   Past Surgical History:  Procedure Laterality Date   FRACTURE SURGERY Left 2006   Facial fractures with metal plates   FRACTURE SURGERY Left    left ankle with screws placed       Home Medications    Prior to Admission medications   Medication Sig Start Date End Date Taking? Authorizing Provider  ibuprofen (ADVIL) 800 MG tablet Take 1 tablet (800 mg total) by mouth 3 (three) times daily. 10/30/20  Yes Ivette Loyal, NP  tamsulosin (FLOMAX) 0.4 MG CAPS capsule Take 1 capsule (0.4 mg total) by mouth daily. 10/30/20  Yes Ivette Loyal, NP    Family History Family History  Problem Relation Age of Onset   Diabetes Mother    Heart failure Mother    Heart failure Father     Social History Social History   Tobacco Use   Smoking status: Every Day    Packs/day: 1.00    Years: 30.00    Pack years: 30.00    Types: Cigarettes   Smokeless tobacco: Never  Substance Use Topics   Alcohol use: Yes    Alcohol/week: 6.0 standard drinks    Types: 6 Cans of beer per week   Drug use: Yes     Frequency: 2.0 times per week    Types: Marijuana     Allergies   Bee venom   Review of Systems Review of Systems  Genitourinary:  Positive for flank pain. Negative for dysuria, hematuria and urgency.  Musculoskeletal:  Positive for back pain.  All other systems reviewed and are negative.   Physical Exam Triage Vital Signs ED Triage Vitals  Enc Vitals Group     BP 10/30/20 0935 (!) 126/94     Pulse Rate 10/30/20 0935 66     Resp 10/30/20 0935 19     Temp 10/30/20 0935 98.7 F (37.1 C)     Temp Source 10/30/20 0935 Oral     SpO2 10/30/20 0935 99 %     Weight --      Height --      Head Circumference --      Peak Flow --      Pain Score 10/30/20 0933 10     Pain Loc --      Pain Edu? --      Excl. in GC? --    No data found.  Updated Vital Signs BP (!) 126/94 (BP Location: Right Arm)  Pulse 66   Temp 98.7 F (37.1 C) (Oral)   Resp 19   SpO2 99%   Visual Acuity Right Eye Distance:   Left Eye Distance:   Bilateral Distance:    Right Eye Near:   Left Eye Near:    Bilateral Near:     Physical Exam Vitals and nursing note reviewed.  Constitutional:      General: He is not in acute distress.    Appearance: Normal appearance. He is not ill-appearing, toxic-appearing or diaphoretic.  HENT:     Head: Normocephalic and atraumatic.  Eyes:     Conjunctiva/sclera: Conjunctivae normal.  Cardiovascular:     Rate and Rhythm: Normal rate.     Pulses: Normal pulses.  Pulmonary:     Effort: Pulmonary effort is normal.  Abdominal:     General: Abdomen is flat.     Tenderness: There is left CVA tenderness. There is no right CVA tenderness.  Musculoskeletal:        General: Normal range of motion.     Cervical back: Normal range of motion.  Skin:    General: Skin is warm and dry.  Neurological:     General: No focal deficit present.     Mental Status: He is alert and oriented to person, place, and time.  Psychiatric:        Mood and Affect: Mood normal.      UC Treatments / Results  Labs (all labs ordered are listed, but only abnormal results are displayed) Labs Reviewed  POCT URINALYSIS DIPSTICK, ED / UC - Abnormal; Notable for the following components:      Result Value   Hgb urine dipstick TRACE (*)    Protein, ur 100 (*)    All other components within normal limits    EKG   Radiology No results found.  Procedures Procedures (including critical care time)  Medications Ordered in UC Medications - No data to display  Initial Impression / Assessment and Plan / UC Course  I have reviewed the triage vital signs and the nursing notes.  Pertinent labs & imaging results that were available during my care of the patient were reviewed by me and considered in my medical decision making (see chart for details).    Assessment negative for red flags or concerns.  Urinalysis positive for hgb and protein.  Likely renal colic on the left side vs kidney stone.  Will treat with Flomax daily and increased hydration.  May take Ibuprofen as needed for pain.  May stop Flomax if symptoms resolve or he notices a stone in urine.  May follow up with urology if symptoms do not improve.  Strict ED follow up for any worsening symptoms, including worsening pain, inability to urinate, or gross hematuria.   Final Clinical Impressions(s) / UC Diagnoses   Final diagnoses:  Renal colic     Discharge Instructions      Take the Flomax daily and drink plenty of water.   You can take the Ibuprofen three times a day as needed for pain.  You can also take Tylenol as needed.    If your pain resolves or you notice that you have passed a kidney stone, you can stop the Flomax.   You can follow up with urology for further evaluation if your symptoms do not improve.   Go to the ED for further evaluation if you develop any worsening pain, bright red blood in your urine, or are unable to urinate.  ED Prescriptions     Medication Sig Dispense Auth.  Provider   tamsulosin (FLOMAX) 0.4 MG CAPS capsule Take 1 capsule (0.4 mg total) by mouth daily. 30 capsule Ivette Loyal, NP   ibuprofen (ADVIL) 800 MG tablet Take 1 tablet (800 mg total) by mouth 3 (three) times daily. 21 tablet Ivette Loyal, NP      PDMP not reviewed this encounter.   Ivette Loyal, NP 10/30/20 1056

## 2020-10-30 NOTE — ED Triage Notes (Signed)
Pt presents with c/o left back pain.   States his back hurts during movements. Pt states he has been concerned that he may have a problem with his kidney.

## 2023-09-19 ENCOUNTER — Ambulatory Visit: Payer: Self-pay | Admitting: Urgent Care

## 2023-09-19 ENCOUNTER — Ambulatory Visit
Admission: EM | Admit: 2023-09-19 | Discharge: 2023-09-19 | Disposition: A | Discharge status: Home / Self Care | Attending: Family Medicine | Admitting: Family Medicine

## 2023-09-19 ENCOUNTER — Ambulatory Visit (INDEPENDENT_AMBULATORY_CARE_PROVIDER_SITE_OTHER)

## 2023-09-19 DIAGNOSIS — R252 Cramp and spasm: Secondary | ICD-10-CM | POA: Diagnosis not present

## 2023-09-19 DIAGNOSIS — M25511 Pain in right shoulder: Secondary | ICD-10-CM

## 2023-09-19 DIAGNOSIS — R42 Dizziness and giddiness: Secondary | ICD-10-CM

## 2023-09-19 LAB — POCT URINALYSIS DIP (MANUAL ENTRY)
Bilirubin, UA: NEGATIVE
Blood, UA: NEGATIVE
Glucose, UA: NEGATIVE mg/dL
Ketones, POC UA: NEGATIVE mg/dL
Leukocytes, UA: NEGATIVE
Nitrite, UA: NEGATIVE
Protein Ur, POC: NEGATIVE mg/dL
Spec Grav, UA: 1.02 (ref 1.010–1.025)
Urobilinogen, UA: 0.2 U/dL
pH, UA: 5.5 (ref 5.0–8.0)

## 2023-09-19 LAB — POCT FASTING CBG KUC MANUAL ENTRY: POCT Glucose (KUC): 139 mg/dL — AB (ref 70–99)

## 2023-09-19 MED ORDER — CYCLOBENZAPRINE HCL 5 MG PO TABS: 5.0000 mg | ORAL_TABLET | Freq: Every evening | ORAL | 0 refills | Status: DC | PRN

## 2023-09-19 NOTE — ED Triage Notes (Signed)
 Patent reports he felt lightheaded today at work. Patient ate a peanut butter sandwich and cheese bites.Patient states he has chills and leg pain. Patient states he does not have a primary care provider.  Denies any fever,vomiting or diarrhea.

## 2023-09-19 NOTE — ED Provider Notes (Signed)
 [facility]  Note:  This document was prepared using Conservation officer, historic buildings and may include unintentional dictation errors.  MRN: [id number] DOB: 1971-04-14  Subjective:   Walter Collins is a 53 y.o. male presenting for multiple concerns.  Had an episode of lightheadedness and dizziness at work today.  Symptoms started after he ate a peanut butter sandwich and cheese bites.  That was approximately 3 hours prior to arrival in clinic.  No fever, confusion, vision changes, polydipsia, polyuria, paresthesia, nausea, vomiting, abdominal pain, chest pain, heart racing, palpitations.  No history of diabetes.  Patient does have a history of hypertension but is not on any medications for it now.  No history of heart disease.  Patient is a smoker, has 40 pack year history. Reports 2-week history of intermittent lower leg cramps worse through the medial thighs bilaterally but also experiences them on his flank sides. Denies dysuria, hematuria, urinary frequency, penile discharge, penile swelling, testicular pain, testicular swelling, anal pain, groin pain.  No history of renal stones. Reports several week history of persistent right shoulder pain, grinding sensation.  No fall, trauma, rashes.  Does not have an orthopedist.  Does not see a PCP.  No chronic medications.  Allergies  Allergen Reactions   Bee Venom Anaphylaxis and Swelling    Past Medical History:  Diagnosis Date   Hypertension    'when I was little'     Past Surgical History:  Procedure Laterality Date   FRACTURE SURGERY Left 2006   Facial fractures with metal plates   FRACTURE SURGERY Left    left ankle with screws placed    Family History  Problem Relation Age of Onset   Diabetes Mother    Heart failure Mother    Heart failure Father     Social History   Tobacco Use   Smoking status: Every Day    Current packs/day: 1.00    Average packs/day: 1 pack/day for 30.0 years (30.0 ttl  pk-yrs)    Types: Cigarettes   Smokeless tobacco: Never  Substance Use Topics   Alcohol use: Yes    Alcohol/week: 6.0 standard drinks of alcohol    Types: 6 Cans of beer per week   Drug use: Yes    Frequency: 2.0 times per week    Types: Marijuana    ROS   Objective:   Vitals: BP 122/83 (BP Location: Left Arm)   Pulse 76   Temp 98.9 F (37.2 C) (Oral)   Resp 16   SpO2 95%   Physical Exam Constitutional:      General: He is not in acute distress.    Appearance: Normal appearance. He is well-developed and normal weight. He is not ill-appearing, toxic-appearing or diaphoretic.  HENT:     Head: Normocephalic and atraumatic.     Right Ear: External ear normal.     Left Ear: External ear normal.     Nose: Nose normal.     Mouth/Throat:     Mouth: Mucous membranes are moist.     Pharynx: Oropharynx is clear. No pharyngeal swelling, oropharyngeal exudate, posterior oropharyngeal erythema or uvula swelling.     Tonsils: No tonsillar exudate or tonsillar abscesses. 0 on the right. 0 on the left.  Eyes:     General: No scleral icterus.       Right eye: No discharge.        Left eye: No discharge.     Extraocular Movements: Extraocular movements intact.  Cardiovascular:  Rate and Rhythm: Normal rate and regular rhythm.     Heart sounds: Normal heart sounds. No murmur heard.    No friction rub. No gallop.  Pulmonary:     Effort: Pulmonary effort is normal. No respiratory distress.     Breath sounds: Normal breath sounds. No stridor. No wheezing, rhonchi or rales.  Musculoskeletal:        General: Normal range of motion.     Right shoulder: Tenderness (movement pain, + Hawkin and Neer tests) present. No swelling, deformity, effusion, laceration, bony tenderness or crepitus. Normal range of motion. Normal strength.     Cervical back: Normal range of motion.  Neurological:     Mental Status: He is alert and oriented to person, place, and time.     Cranial Nerves: No cranial  nerve deficit.     Motor: No weakness.     Coordination: Coordination normal.     Gait: Gait normal.     Deep Tendon Reflexes: Reflexes normal.  Psychiatric:        Mood and Affect: Mood normal.        Behavior: Behavior normal.        Thought Content: Thought content normal.        Judgment: Judgment normal.     Results for orders placed or performed during the hospital encounter of 09/19/23 (from the past 24 hours)  POCT CBG (manual entry)     Status: Abnormal   Collection Time: 09/19/23 11:37 AM  Result Value Ref Range   POCT Glucose ([facility]) 139 (A) 70 - 99 mg/dL  POCT urinalysis dipstick     Status: None   Collection Time: 09/19/23 11:54 AM  Result Value Ref Range   Color, UA yellow yellow   Clarity, UA clear clear   Glucose, UA negative negative mg/dL   Bilirubin, UA negative negative   Ketones, POC UA negative negative mg/dL   Spec Grav, UA 4.098 1.191 - 1.025   Blood, UA negative negative   pH, UA 5.5 5.0 - 8.0   Protein Ur, POC negative negative mg/dL   Urobilinogen, UA 0.2 0.2 or 1.0 E.U./dL   Nitrite, UA Negative Negative   Leukocytes, UA Negative Negative    Assessment and Plan :   PDMP not reviewed this encounter.  1. Muscle cramps   2. Lightheadedness   3. Acute pain of right shoulder    Labs pending.  Recommended conservative management with Tylenol , Flexeril for his right shoulder pain.  Hydrate more consistently.  Follow-up with an orthopedist group especially for his shoulder and also muscle cramps and spasms.  It is possible that he is developing claudication given his longstanding history of smoking.  Will have patient follow-up with his PCP regarding this issue.  I did recommend smoking cessation but the patient was not receptive.  Counseled patient on potential for adverse effects with medications prescribed/recommended today, ER and return-to-clinic precautions discussed, patient verbalized understanding.    Adolph Hoop, [place] 09/19/23 1419

## 2023-09-20 LAB — COMPREHENSIVE METABOLIC PANEL WITH GFR
ALT: 25 IU/L (ref 0–44)
AST: 25 IU/L (ref 0–40)
Albumin: 4.6 g/dL (ref 3.8–4.9)
Alkaline Phosphatase: 70 IU/L (ref 44–121)
BUN/Creatinine Ratio: 13 (ref 9–20)
BUN: 14 mg/dL (ref 6–24)
Bilirubin Total: <0.2 mg/dL (ref 0.0–1.2)
CO2: 21 mmol/L (ref 20–29)
Calcium: 9.6 mg/dL (ref 8.7–10.2)
Chloride: 104 mmol/L (ref 96–106)
Creatinine, Ser: 1.06 mg/dL (ref 0.76–1.27)
Globulin, Total: 2.6 g/dL (ref 1.5–4.5)
Glucose: 67 mg/dL — ABNORMAL LOW (ref 70–99)
Potassium: 4.5 mmol/L (ref 3.5–5.2)
Sodium: 140 mmol/L (ref 134–144)
Total Protein: 7.2 g/dL (ref 6.0–8.5)
eGFR: 83 mL/min/{1.73_m2} (ref >59)

## 2023-09-20 LAB — CK: Total CK: 348 U/L — ABNORMAL HIGH (ref 41–331)

## 2023-10-31 ENCOUNTER — Ambulatory Visit
Admission: EM | Admit: 2023-10-31 | Discharge: 2023-10-31 | Disposition: A | Discharge status: Home / Self Care | Attending: Family Medicine | Admitting: Family Medicine

## 2023-10-31 DIAGNOSIS — B86 Scabies: Secondary | ICD-10-CM | POA: Diagnosis not present

## 2023-10-31 MED ORDER — PERMETHRIN 5 % EX CREA: TOPICAL_CREAM | CUTANEOUS | 1 refills | Status: AC

## 2023-10-31 NOTE — ED Provider Notes (Signed)
 [facility]  Note:  This document was prepared using Conservation officer, historic buildings and may include unintentional dictation errors.  MRN: [id number] DOB: 1970/07/13  Subjective:   Walter Collins is a 53 y.o. male presenting for 1 week history of persistent itchy rash worse over the arms but now spread to the left side of his face and to a lesser degree the lower legs.  Denies eating any new foods, starting new medications, exposure to poisonous plants, new hygiene products, new cleaning products or detergents.   No current facility-administered medications for this encounter.  Current Outpatient Medications:    cyclobenzaprine  (FLEXERIL ) 5 MG tablet, Take 1 tablet (5 mg total) by mouth at bedtime as needed., Disp: 30 tablet, Rfl: 0   ibuprofen  (ADVIL ) 800 MG tablet, Take 1 tablet (800 mg total) by mouth 3 (three) times daily., Disp: 21 tablet, Rfl: 0   tamsulosin  (FLOMAX ) 0.4 MG CAPS capsule, Take 1 capsule (0.4 mg total) by mouth daily., Disp: 30 capsule, Rfl: 0   Allergies  Allergen Reactions   Bee Venom Anaphylaxis and Swelling    Past Medical History:  Diagnosis Date   Hypertension    'when I was little'     Past Surgical History:  Procedure Laterality Date   FRACTURE SURGERY Left 2006   Facial fractures with metal plates   FRACTURE SURGERY Left    left ankle with screws placed    Family History  Problem Relation Age of Onset   Diabetes Mother    Heart failure Mother    Heart failure Father     Social History   Tobacco Use   Smoking status: Every Day    Current packs/day: 1.00    Average packs/day: 1 pack/day for 30.0 years (30.0 ttl pk-yrs)    Types: Cigarettes   Smokeless tobacco: Never  Vaping Use   Vaping status: Never Used  Substance Use Topics   Alcohol use: Yes    Alcohol/week: 6.0 standard drinks of alcohol    Types: 6 Cans of beer per week   Drug use: Yes    Frequency: 2.0 times per week    Types: Marijuana     ROS   Objective:   Vitals: BP (!) 143/91 (BP Location: Left Arm)   Pulse (!) 59   Temp 98.1 F (36.7 C) (Oral)   Resp 16   SpO2 96%   Physical Exam Constitutional:      General: He is not in acute distress.    Appearance: Normal appearance. He is well-developed and normal weight. He is not ill-appearing, toxic-appearing or diaphoretic.  HENT:     Head: Normocephalic and atraumatic.     Right Ear: External ear normal.     Left Ear: External ear normal.     Nose: Nose normal.     Mouth/Throat:     Pharynx: Oropharynx is clear.  Eyes:     General: No scleral icterus.       Right eye: No discharge.        Left eye: No discharge.     Extraocular Movements: Extraocular movements intact.  Cardiovascular:     Rate and Rhythm: Normal rate.  Pulmonary:     Effort: Pulmonary effort is normal.  Musculoskeletal:     Cervical back: Normal range of motion.  Skin:    Findings: Rash (patches of excoriations and burrows worst over the forearms but also to a lesser degree over the left side of his face and lower  legs as depicted) present.  Neurological:     Mental Status: He is alert and oriented to person, place, and time.  Psychiatric:        Mood and Affect: Mood normal.        Behavior: Behavior normal.        Thought Content: Thought content normal.        Judgment: Judgment normal.              Assessment and Plan :   PDMP not reviewed this encounter.  1. Scabies    Will manage for scabies using permethrin  cream.  Counseled patient on potential for adverse effects with medications prescribed/recommended today, ER and return-to-clinic precautions discussed, patient verbalized understanding.    Christopher Savannah, PA-C 10/31/23 1108

## 2023-10-31 NOTE — ED Triage Notes (Signed)
 Patient has a rash on both his arms and some on his legs.  Patient has had the rash for about a week.  Patient put alcohol on it.

## 2023-11-03 ENCOUNTER — Ambulatory Visit
Admission: EM | Admit: 2023-11-03 | Discharge: 2023-11-03 | Disposition: A | Discharge status: Home / Self Care | Attending: Family Medicine | Admitting: Family Medicine

## 2023-11-03 DIAGNOSIS — B86 Scabies: Secondary | ICD-10-CM

## 2023-11-03 MED ORDER — IVERMECTIN 3 MG PO TABS: 200.0000 ug/kg | ORAL_TABLET | Freq: Once | ORAL | 0 refills | Status: AC

## 2023-11-03 NOTE — ED Triage Notes (Signed)
 Pt present with c/o scabies. Pt states the cream he was prescribed is not working for him. Pt states he feels miserable.

## 2023-11-03 NOTE — Discharge Instructions (Signed)
 Start ivermectin  today.  You will take 6 tablets today then repeat the dose in 14 days.  You may repeat your permethrin  topical treatment in 7 days as well.  Please follow-up with your PCP or dermatology if your symptoms do not improve with this treatment.  You may use over-the-counter Benadryl to help with itching.  Please follow-up with your PCP for [facility] paperwork.  Please go to the ER if you develop any worsening symptoms.  Hope you feel better soon!

## 2023-11-03 NOTE — ED Provider Notes (Addendum)
 [facility]    CSN: [id number] Arrival date & time: 11/03/23  1053      History   Chief Complaint Chief Complaint  Patient presents with   Sarcoptes Scabiei    HPI Walter Collins is a 53 y.o. male presents for follow-up of scabies.  Patient was seen in urgent care on July 19 and diagnosed with scabies.  He was started on permethrin  with 1 refill.  He states he did the application same day but states he has had no improvement and continues to have severe itching and symptoms.  He states he is trying to treat his home but states no other household members have similar symptoms.  He states it was discussed that his first visit possibly doing ivermectin  but due to cost he chose topical permethrin  instead.  He is interested in doing ivermectin .  He also is requesting FMLA paperwork to be filled out.  He states he does have a PCP appointment but it is not until August 7.  No other concerns at this time.  HPI  Past Medical History:  Diagnosis Date   Hypertension    'when I was little'    There are no active problems to display for this patient.   Past Surgical History:  Procedure Laterality Date   FRACTURE SURGERY Left 2006   Facial fractures with metal plates   FRACTURE SURGERY Left    left ankle with screws placed       Home Medications    Prior to Admission medications   Medication Sig Start Date End Date Taking? Authorizing Provider  ivermectin  (STROMECTOL ) 3 MG TABS tablet Take 6 tablets (18,000 mcg total) by mouth once for 1 dose. Take 6 tablets today and repeat in 14 days 11/03/23 11/03/23 Yes Ichiro Chesnut, Jodi R, NP  permethrin  (ELIMITE ) 5 % cream Thoroughly massage cream from head to soles of feet; leave on for 8 to 14 hours before removing (shower or bath). 10/31/23   Christopher Savannah, PA-C  tamsulosin  (FLOMAX ) 0.4 MG CAPS capsule Take 1 capsule (0.4 mg total) by mouth daily. 10/30/20   Claudene Ashley SAUNDERS, NP    Family History Family History  Problem Relation Age of Onset    Diabetes Mother    Heart failure Mother    Heart failure Father     Social History Social History   Tobacco Use   Smoking status: Every Day    Current packs/day: 1.00    Average packs/day: 1 pack/day for 30.0 years (30.0 ttl pk-yrs)    Types: Cigarettes   Smokeless tobacco: Never  Vaping Use   Vaping status: Never Used  Substance Use Topics   Alcohol use: Yes    Alcohol/week: 6.0 standard drinks of alcohol    Types: 6 Cans of beer per week   Drug use: Yes    Frequency: 2.0 times per week    Types: Marijuana     Allergies   Bee venom   Review of Systems Review of Systems  Skin:  Positive for rash.     Physical Exam Triage Vital Signs ED Triage Vitals  Encounter Vitals Group     BP 11/03/23 1112 (!) 164/96     Girls Systolic BP Percentile --      Girls Diastolic BP Percentile --      Boys Systolic BP Percentile --      Boys Diastolic BP Percentile --      Pulse Rate 11/03/23 1112 67     Resp 11/03/23 1112  17     Temp 11/03/23 1112 98.8 F (37.1 C)     Temp Source 11/03/23 1112 Oral     SpO2 11/03/23 1112 95 %     Weight --      Height --      Head Circumference --      Peak Flow --      Pain Score 11/03/23 1117 0     Pain Loc --      Pain Education --      Exclude from Growth Chart --    No data found.  Updated Vital Signs BP (!) 164/96 (BP Location: Right Arm)   Pulse 67   Temp 98.8 F (37.1 C) (Oral)   Resp 17   Wt 202 lb 9.6 oz (91.9 kg)   SpO2 95%   BMI 30.81 kg/m   Visual Acuity Right Eye Distance:   Left Eye Distance:   Bilateral Distance:    Right Eye Near:   Left Eye Near:    Bilateral Near:     Physical Exam Vitals and nursing note reviewed.  Constitutional:      General: He is not in acute distress.    Appearance: Normal appearance. He is not ill-appearing.  HENT:     Head: Normocephalic and atraumatic.  Eyes:     Pupils: Pupils are equal, round, and reactive to light.  Cardiovascular:     Rate and Rhythm: Normal  rate.  Pulmonary:     Effort: Pulmonary effort is normal.  Skin:    General: Skin is warm and dry.     Comments: Multiple excoriations on forearms, backs of hands and between fingers and on face.  See photos from previous visit on July 19  Neurological:     General: No focal deficit present.     Mental Status: He is alert and oriented to person, place, and time.  Psychiatric:        Mood and Affect: Mood normal.        Behavior: Behavior normal.      UC Treatments / Results  Labs (all labs ordered are listed, but only abnormal results are displayed) Labs Reviewed - No data to display  EKG   Radiology No results found.  Procedures Procedures (including critical care time)  Medications Ordered in UC Medications - No data to display  Initial Impression / Assessment and Plan / UC Course  I have reviewed the triage vital signs and the nursing notes.  Pertinent labs & imaging results that were available during my care of the patient were reviewed by me and considered in my medical decision making (see chart for details).     Reviewed exam and symptoms with patient.  No signs of secondary infection.  Will do ivermectin  1 dose today with repeat dose in 14 days.  Medication list reviewed no drug interactions.  He may repeat the permethrin  topical in 7 days as well.  May do over-the-counter Benadryl as needed for itching.  Discussed continued treatment of his house.  Discussed that we do not do FMLA paperwork in urgent care and that he will need to follow-up with his PCP regarding this paperwork.  I did provide him a work note for the remainder of the week.  Discussed if he does not have resolution of symptoms with this treatment he will need to see a dermatologist and/or his PCP for further treatment.  ER precautions reviewed. Final Clinical Impressions(s) / UC Diagnoses   Final diagnoses:  Scabies     Discharge Instructions      Start ivermectin  today.  You will take 6  tablets today then repeat the dose in 14 days.  You may repeat your permethrin  topical treatment in 7 days as well.  Please follow-up with your PCP or dermatology if your symptoms do not improve with this treatment.  You may use over-the-counter Benadryl to help with itching.  Please follow-up with your PCP for [facility] paperwork.  Please go to the ER if you develop any worsening symptoms.  Hope you feel better soon!     ED Prescriptions     Medication Sig Dispense Auth. Provider   ivermectin  (STROMECTOL ) 3 MG TABS tablet Take 6 tablets (18,000 mcg total) by mouth once for 1 dose. Take 6 tablets today and repeat in 14 days 12 tablet Kindall Swaby, Jodi R, NP      PDMP not reviewed this encounter.   Loreda Myla SAUNDERS, NP 11/03/23 1141    Loreda Myla SAUNDERS, NP 11/03/23 1141

## 2023-11-04 ENCOUNTER — Ambulatory Visit: Payer: Self-pay | Admitting: Family Medicine

## 2023-11-04 ENCOUNTER — Encounter: Payer: Self-pay | Admitting: Family Medicine

## 2023-11-04 VITALS — Temp 97.9°F | Ht 68.0 in | Wt 197.0 lb

## 2023-11-04 DIAGNOSIS — B86 Scabies: Secondary | ICD-10-CM | POA: Diagnosis not present

## 2023-11-04 DIAGNOSIS — F17200 Nicotine dependence, unspecified, uncomplicated: Secondary | ICD-10-CM

## 2023-11-04 MED ORDER — HYDROXYZINE HCL 50 MG PO TABS: 50.0000 mg | ORAL_TABLET | Freq: Three times a day (TID) | ORAL | 0 refills | Status: AC | PRN

## 2023-11-04 MED ORDER — TRIAMCINOLONE ACETONIDE 0.5 % EX OINT
1.0000 | TOPICAL_OINTMENT | Freq: Two times a day (BID) | CUTANEOUS | 3 refills | Status: AC
Start: 2023-11-04 — End: ?

## 2023-11-04 NOTE — Progress Notes (Signed)
 Assessment & Plan   Assessment/Plan:    Assessment & Plan Scabies Scabies diagnosed on October 31, 2023. Initial treatment with permethrin  cream was ineffective. Ivermectin  prescribed on November 03, 2023, with instructions for an initial dose of six pills and a second dose in 14 days. Reports reduced pruritus since starting ivermectin . Emphasized completing the ivermectin  course to prevent resistance and recurrence. Not considered contagious post-ivermectin  if contact precautions are observed, but prefers to wait for symptom resolution before returning to work. Hydroxyzine  prescribed for pruritus, with sedation caution. Triamcinolone  0.5% topical ointment prescribed for local inflammation and pruritus. Advised reducing smoking to aid recovery as smoking can delay wound healing. - Continue ivermectin  as prescribed, with a second dose in 14 days - Prescribe hydroxyzine  for pruritus, cautioning about sedation - Prescribe triamcinolone  0.5% topical ointment for local inflammation and pruritus - Advise reducing smoking to promote faster healing - Complete FMLA paperwork for work absence from July 20 to November 11, 2023  Tobacco Use Disorder Smokes a pack of cigarettes per day, with increased consumption due to being at home. Advised reducing smoking to aid recovery as smoking can delay wound healing. - Advise reducing smoking to promote faster healing      There are no discontinued medications.  Return if symptoms worsen or fail to improve.        Subjective:   Encounter date: 11/04/2023  Walter Collins is a 53 y.o. male who does not have a problem list on file.SABRA   He  has a past medical history of Hypertension.SABRA   He presents with chief complaint of No chief complaint on file. .   Discussed the use of AI scribe software for clinical note transcription with the patient, who gave verbal consent to proceed.  History of Present Illness Walter Collins is a 53 year old male who presents for  follow-up after treatment for scabies.  He was diagnosed with scabies on October 31, 2023, and initially treated with permethrin  cream, which provided no relief. He returned to urgent care on November 03, 2023, and was prescribed ivermectin . He began the ivermectin  treatment yesterday, taking six pills at once, with plans to take another six in fourteen days. He notes feeling 'a little better' with reduced itching today.  He experiences significant itching and difficulty sleeping due to the scabies. To aid sleep, he has been using alcohol. He is concerned about infecting others and has been taking precautions to avoid contact.  He is currently unable to work due to his condition and is in the process of completing FMLA paperwork for his absence from work, which began on November 01, 2023. He works in a [employer] and is worried about returning to work while still visibly affected by the scabies.  He smokes a pack of cigarettes a day and has been smoking more since being off work.     ROS  Past Surgical History:  Procedure Laterality Date   FRACTURE SURGERY Left 2006   Facial fractures with metal plates   FRACTURE SURGERY Left    left ankle with screws placed    Outpatient Medications Prior to Visit  Medication Sig Dispense Refill   cyclobenzaprine  (FLEXERIL ) 5 MG tablet Take 5 mg by mouth 3 (three) times daily as needed for muscle spasms.     permethrin  (ELIMITE ) 5 % cream Thoroughly massage cream from head to soles of feet; leave on for 8 to 14 hours before removing (shower or bath). 60 g 1   tamsulosin  (FLOMAX ) 0.4 MG  CAPS capsule Take 1 capsule (0.4 mg total) by mouth daily. (Patient not taking: Reported on 11/04/2023) 30 capsule 0   No facility-administered medications prior to visit.    Family History  Problem Relation Age of Onset   Diabetes Mother    Heart failure Mother    Heart failure Father     Social History   Socioeconomic History   Marital status: Single    Spouse name:  Not on file   Number of children: Not on file   Years of education: Not on file   Highest education level: Not on file  Occupational History   Not on file  Tobacco Use   Smoking status: Every Day    Current packs/day: 1.00    Average packs/day: 1 pack/day for 30.0 years (30.0 ttl pk-yrs)    Types: Cigarettes   Smokeless tobacco: Never  Vaping Use   Vaping status: Never Used  Substance and Sexual Activity   Alcohol use: Yes    Alcohol/week: 6.0 standard drinks of alcohol    Types: 6 Cans of beer per week   Drug use: Yes    Frequency: 2.0 times per week    Types: Marijuana   Sexual activity: Yes    Birth control/protection: None  Other Topics Concern   Not on file  Social History Narrative   Not on file   Social Drivers of Health   Financial Resource Strain: Not on file  Food Insecurity: Not on file  Transportation Needs: Not on file  Physical Activity: Not on file  Stress: Not on file  Social Connections: Not on file  Intimate Partner Violence: Not on file                                                                                                  Objective:  Physical Exam: Temp 97.9 F (36.6 C)   Ht 5' 8 (1.727 m)   Wt 197 lb (89.4 kg)   BMI 29.95 kg/m    Physical Exam GENERAL: Alert, cooperative, well developed, no acute distress HEENT: Normocephalic, normal oropharynx, moist mucous membranes CHEST: Clear to auscultation bilaterally, No wheezes, rhonchi, or crackles CARDIOVASCULAR: Normal heart rate and rhythm, S1 and S2 normal without murmurs ABDOMEN: Soft, non-tender, non-distended, without organomegaly, Normal bowel sounds EXTREMITIES: No cyanosis or edema NEUROLOGICAL: Cranial nerves grossly intact, Moves all extremities without gross motor or sensory deficit SKIN: Excoriations and lesions along forearms of front and back up to elbow   Physical Exam  DG Shoulder Right Result Date: 09/19/2023 CLINICAL DATA:  Right shoulder pain. EXAM: RIGHT  SHOULDER - 2+ VIEW COMPARISON:  None Available. FINDINGS: No acute fracture or dislocation. The bones are osteopenic. Mild arthritic changes of the right shoulder. The soft tissues are unremarkable. IMPRESSION: 1. No acute fracture or dislocation. 2. Mild arthritic changes. Electronically Signed   By: Vanetta Chou M.D.   On: 09/19/2023 12:39    Recent Results (from the past 2160 hours)  POCT CBG (manual entry)     Status: Abnormal   Collection Time: 09/19/23 11:37 AM  Result Value Ref Range  POCT Glucose ([facility]) 139 (A) 70 - 99 mg/dL  POCT urinalysis dipstick     Status: None   Collection Time: 09/19/23 11:54 AM  Result Value Ref Range   Color, UA yellow yellow   Clarity, UA clear clear   Glucose, UA negative negative mg/dL   Bilirubin, UA negative negative   Ketones, POC UA negative negative mg/dL   Spec Grav, UA 8.979 8.989 - 1.025   Blood, UA negative negative   pH, UA 5.5 5.0 - 8.0   Protein Ur, POC negative negative mg/dL   Urobilinogen, UA 0.2 0.2 or 1.0 E.U./dL   Nitrite, UA Negative Negative   Leukocytes, UA Negative Negative  Comprehensive metabolic panel     Status: Abnormal   Collection Time: 09/19/23 12:04 PM  Result Value Ref Range   Glucose 67 (L) 70 - 99 mg/dL   BUN 14 6 - 24 mg/dL   Creatinine, Ser 8.93 0.76 - 1.27 mg/dL   eGFR 83 >40 fO/fpw/8.26   BUN/Creatinine Ratio 13 9 - 20   Sodium 140 134 - 144 mmol/L   Potassium 4.5 3.5 - 5.2 mmol/L   Chloride 104 96 - 106 mmol/L   CO2 21 20 - 29 mmol/L   Calcium 9.6 8.7 - 10.2 mg/dL   Total Protein 7.2 6.0 - 8.5 g/dL   Albumin 4.6 3.8 - 4.9 g/dL   Globulin, Total 2.6 1.5 - 4.5 g/dL   Bilirubin Total <9.7 0.0 - 1.2 mg/dL   Alkaline Phosphatase 70 44 - 121 IU/L   AST 25 0 - 40 IU/L   ALT 25 0 - 44 IU/L  CK     Status: Abnormal   Collection Time: 09/19/23 12:04 PM  Result Value Ref Range   Total CK 348 (H) 41 - 331 U/L        Beverley Adine Hummer, MD, MS

## 2023-11-04 NOTE — Patient Instructions (Signed)
  VISIT SUMMARY: Walter Collins, a 53 year old male, came in for a follow-up visit after being treated for scabies. He was initially treated with permethrin  cream, which was ineffective, and then prescribed ivermectin , which he started yesterday. He reports feeling a little better with reduced itching. He is concerned about infecting others and has been taking precautions. He is currently unable to work and is completing FMLA paperwork for his absence. He also smokes a pack of cigarettes a day and has been smoking more since being off work.  YOUR PLAN: -SCABIES: Scabies is a skin condition caused by tiny mites that burrow into the skin, causing intense itching and a rash. You were diagnosed on October 31, 2023, and initially treated with permethrin  cream, which did not help. You were then prescribed ivermectin , which you started yesterday. Continue taking the ivermectin  as prescribed, with a second dose in 14 days. To help with itching, you have been prescribed hydroxyzine , but be cautious as it can cause drowsiness. You also have triamcinolone  0.5% topical ointment for local inflammation and itching. Reducing smoking is advised to help your recovery, as smoking can delay wound healing. You should complete your FMLA paperwork for your work absence from July 20 to November 11, 2023.  -TOBACCO USE DISORDER: Tobacco use disorder is a condition where you are dependent on nicotine, leading to regular smoking. You currently smoke a pack of cigarettes a day and have been smoking more since being off work. Reducing your smoking is advised to promote faster healing, as smoking can delay wound healing.  INSTRUCTIONS: Continue taking the ivermectin  as prescribed, with a second dose in 14 days. Use hydroxyzine  for itching, but be cautious as it can cause drowsiness. Apply triamcinolone  0.5% topical ointment for local inflammation and itching. Reduce smoking to aid in your recovery. Complete your FMLA paperwork for your work  absence from July 20 to November 11, 2023.

## 2023-11-06 NOTE — Telephone Encounter (Signed)
 Paperwork for employment was filled out and given back to patient before leaving the office on 11/04/23.

## 2023-11-19 ENCOUNTER — Ambulatory Visit: Payer: Self-pay | Admitting: Family Medicine

## 2023-11-19 ENCOUNTER — Encounter: Payer: Self-pay | Admitting: Family Medicine

## 2023-11-19 VITALS — BP 124/78 | HR 73 | Temp 99.0°F | Ht 68.0 in | Wt 207.0 lb

## 2023-11-19 DIAGNOSIS — F17200 Nicotine dependence, unspecified, uncomplicated: Secondary | ICD-10-CM

## 2023-11-19 DIAGNOSIS — Z23 Encounter for immunization: Secondary | ICD-10-CM

## 2023-11-19 DIAGNOSIS — E66811 Obesity, class 1: Secondary | ICD-10-CM

## 2023-11-19 DIAGNOSIS — D229 Melanocytic nevi, unspecified: Secondary | ICD-10-CM | POA: Diagnosis not present

## 2023-11-19 DIAGNOSIS — M542 Cervicalgia: Secondary | ICD-10-CM

## 2023-11-19 DIAGNOSIS — M25511 Pain in right shoulder: Secondary | ICD-10-CM | POA: Diagnosis not present

## 2023-11-19 DIAGNOSIS — R351 Nocturia: Secondary | ICD-10-CM

## 2023-11-19 DIAGNOSIS — Z113 Encounter for screening for infections with a predominantly sexual mode of transmission: Secondary | ICD-10-CM

## 2023-11-19 DIAGNOSIS — Z833 Family history of diabetes mellitus: Secondary | ICD-10-CM

## 2023-11-19 DIAGNOSIS — Z114 Encounter for screening for human immunodeficiency virus [HIV]: Secondary | ICD-10-CM

## 2023-11-19 DIAGNOSIS — Z1211 Encounter for screening for malignant neoplasm of colon: Secondary | ICD-10-CM

## 2023-11-19 DIAGNOSIS — Z136 Encounter for screening for cardiovascular disorders: Secondary | ICD-10-CM

## 2023-11-19 DIAGNOSIS — M25572 Pain in left ankle and joints of left foot: Secondary | ICD-10-CM | POA: Insufficient documentation

## 2023-11-19 DIAGNOSIS — Z1159 Encounter for screening for other viral diseases: Secondary | ICD-10-CM

## 2023-11-19 DIAGNOSIS — Z2821 Immunization not carried out because of patient refusal: Secondary | ICD-10-CM

## 2023-11-19 DIAGNOSIS — Z7689 Persons encountering health services in other specified circumstances: Secondary | ICD-10-CM

## 2023-11-19 DIAGNOSIS — G8929 Other chronic pain: Secondary | ICD-10-CM

## 2023-11-19 DIAGNOSIS — Z6831 Body mass index (BMI) 31.0-31.9, adult: Secondary | ICD-10-CM

## 2023-11-19 DIAGNOSIS — Z0000 Encounter for general adult medical examination without abnormal findings: Secondary | ICD-10-CM

## 2023-11-19 DIAGNOSIS — E6609 Other obesity due to excess calories: Secondary | ICD-10-CM

## 2023-11-19 MED ORDER — DICLOFENAC SODIUM 1 % EX GEL
4.0000 g | Freq: Two times a day (BID) | CUTANEOUS | 1 refills | Status: AC | PRN
Start: 2023-11-19 — End: ?

## 2023-11-19 NOTE — Patient Instructions (Signed)
 Health Maintenance, Male  Adopting a healthy lifestyle and getting preventive care are important in promoting health and wellness. Ask your health care provider about:  The right schedule for you to have regular tests and exams.  Things you can do on your own to prevent diseases and keep yourself healthy.  What should I know about diet, weight, and exercise?  Eat a healthy diet    Eat a diet that includes plenty of vegetables, fruits, low-fat dairy products, and lean protein.  Do not eat a lot of foods that are high in solid fats, added sugars, or sodium.  Maintain a healthy weight  Body mass index (BMI) is a measurement that can be used to identify possible weight problems. It estimates body fat based on height and weight. Your health care provider can help determine your BMI and help you achieve or maintain a healthy weight.  Get regular exercise  Get regular exercise. This is one of the most important things you can do for your health. Most adults should:  Exercise for at least 150 minutes each week. The exercise should increase your heart rate and make you sweat (moderate-intensity exercise).  Do strengthening exercises at least twice a week. This is in addition to the moderate-intensity exercise.  Spend less time sitting. Even light physical activity can be beneficial.  Watch cholesterol and blood lipids  Have your blood tested for lipids and cholesterol at 55 years of age, then have this test every 5 years.  You may need to have your cholesterol levels checked more often if:  Your lipid or cholesterol levels are high.  You are older than 55 years of age.  You are at high risk for heart disease.  What should I know about cancer screening?  Many types of cancers can be detected early and may often be prevented. Depending on your health history and family history, you may need to have cancer screening at various ages. This may include screening for:  Colorectal cancer.  Prostate cancer.  Skin cancer.  Lung  cancer.  What should I know about heart disease, diabetes, and high blood pressure?  Blood pressure and heart disease  High blood pressure causes heart disease and increases the risk of stroke. This is more likely to develop in people who have high blood pressure readings or are overweight.  Talk with your health care provider about your target blood pressure readings.  Have your blood pressure checked:  Every 3-5 years if you are 9-95 years of age.  Every year if you are 85 years old or older.  If you are between the ages of 29 and 29 and are a current or former smoker, ask your health care provider if you should have a one-time screening for abdominal aortic aneurysm (AAA).  Diabetes  Have regular diabetes screenings. This checks your fasting blood sugar level. Have the screening done:  Once every three years after age 23 if you are at a normal weight and have a low risk for diabetes.  More often and at a younger age if you are overweight or have a high risk for diabetes.  What should I know about preventing infection?  Hepatitis B  If you have a higher risk for hepatitis B, you should be screened for this virus. Talk with your health care provider to find out if you are at risk for hepatitis B infection.  Hepatitis C  Blood testing is recommended for:  Everyone born from [birth year] through 1967.  Anyone  with known risk factors for hepatitis C.  Sexually transmitted infections (STIs)  You should be screened each year for STIs, including gonorrhea and chlamydia, if:  You are sexually active and are younger than 55 years of age.  You are older than 55 years of age and your health care provider tells you that you are at risk for this type of infection.  Your sexual activity has changed since you were last screened, and you are at increased risk for chlamydia or gonorrhea. Ask your health care provider if you are at risk.  Ask your health care provider about whether you are at high risk for HIV. Your health care provider  may recommend a prescription medicine to help prevent HIV infection. If you choose to take medicine to prevent HIV, you should first get tested for HIV. You should then be tested every 3 months for as long as you are taking the medicine.  Follow these instructions at home:  Alcohol use  Do not drink alcohol if your health care provider tells you not to drink.  If you drink alcohol:  Limit how much you have to 0-2 drinks a day.  Know how much alcohol is in your drink. In the [place] one drink equals one 12 oz bottle of beer (355 mL), one 5 oz glass of wine (148 mL), or one 1 oz glass of hard liquor (44 mL).  Lifestyle  Do not use any products that contain nicotine or tobacco. These products include cigarettes, chewing tobacco, and vaping devices, such as e-cigarettes. If you need help quitting, ask your health care provider.  Do not use street drugs.  Do not share needles.  Ask your health care provider for help if you need support or information about quitting drugs.  General instructions  Schedule regular health, dental, and eye exams.  Stay current with your vaccines.  Tell your health care provider if:  You often feel depressed.  You have ever been abused or do not feel safe at home.  Summary  Adopting a healthy lifestyle and getting preventive care are important in promoting health and wellness.  Follow your health care provider's instructions about healthy diet, exercising, and getting tested or screened for diseases.  Follow your health care provider's instructions on monitoring your cholesterol and blood pressure.  This information is not intended to replace advice given to you by your health care provider. Make sure you discuss any questions you have with your health care provider.  Document Revised: 08/20/2020 Document Reviewed: 08/20/2020  Elsevier Patient Education  2024 [employer].

## 2023-11-19 NOTE — Progress Notes (Signed)
 I,Jameka J Llittleton, CMA,acting as a Neurosurgeon for [employer], NP.,have documented all relevant documentation on the behalf of Bruna Creighton, NP,as directed by  Bruna Creighton, NP while in the presence of Bruna Creighton, NP.  Subjective:   Patient ID: Walter Collins , male    DOB: 1970-10-01 , 53 y.o.   MRN: [id number]  Chief Complaint  Patient presents with   Establish Care   Annual Exam    HPI Discussed the use of AI scribe software for clinical note transcription with the patient, who gave verbal consent to proceed.  History of Present Illness     Walter Collins is a 53 year old male who presents to establish care and for an annual physical exam.  He has chronic right shoulder pain, which began approximately five or six years ago while lifting stone at work. The pain is intermittent but has worsened over the last couple of years, affecting his sleep as he struggles to find a comfortable position. He suspects a pinched nerve or rotator cuff issue. He previously saw an orthopedic doctor who performed x-rays of his shoulder and neck; he was told that the tingling and numbness in his arm was due to his neck, and that the bones in his neck were not spaced the way they are supposed to be. A steroid injection provided relief for about a week and a half before the pain returned.  He experiences issues with his ankle, which contains three titanium screws. He has unpredictable episodes where he can be walking normally one moment and limping the next, with the situation resolving within ten minutes.  He has a history of smoking cigarettes, currently smoking a pack a day for the past 43 years, having started at age 12. He experiences wheezing and phlegm production, particularly at night when trying to sleep, but no shortness of breath during activity. He has attempted to quit smoking multiple times, most recently six to seven months ago, but stress at work leads him to resume smoking.  He has a history of scabies,  for which he received medication and cream, and is currently working on restoring his skin to normal. He reports the presence of moles in the groin area and under the belly, which have been present for years and are not painful but are cosmetically concerning to him. He has a history of sexually transmitted diseases, including syphilis and gonorrhea, in his younger years but denies any current symptoms or concerns.      Past Medical History:  Diagnosis Date   Hypertension    'when I was little'     Family History  Problem Relation Age of Onset   Diabetes Mother    Heart failure Mother    Heart failure Father      Current Outpatient Medications:    diclofenac  Sodium (VOLTAREN ) 1 % GEL, Apply 4 g topically 2 (two) times daily as needed., Disp: 20 g, Rfl: 1   permethrin  (ELIMITE ) 5 % cream, Thoroughly massage cream from head to soles of feet; leave on for 8 to 14 hours before removing (shower or bath)., Disp: 60 g, Rfl: 1   cyclobenzaprine  (FLEXERIL ) 5 MG tablet, Take 5 mg by mouth 3 (three) times daily as needed for muscle spasms. (Patient not taking: Reported on 11/19/2023), Disp: , Rfl:    hydrOXYzine  (ATARAX ) 50 MG tablet, Take 1 tablet (50 mg total) by mouth 3 (three) times daily as needed for anxiety. (Patient not taking: Reported on 11/19/2023), Disp: 90 tablet, Rfl:  0   tamsulosin  (FLOMAX ) 0.4 MG CAPS capsule, Take 1 capsule (0.4 mg total) by mouth daily. (Patient not taking: Reported on 11/19/2023), Disp: 30 capsule, Rfl: 0   triamcinolone  ointment (KENALOG ) 0.5 %, Apply 1 Application topically 2 (two) times daily. For moderate to severe eczema.  Do not use for more than 1 week at a time. (Patient not taking: Reported on 11/19/2023), Disp: 60 g, Rfl: 3   Allergies  Allergen Reactions   Bee Venom Anaphylaxis and Swelling      Flowsheet Row Office Visit from 11/19/2023 in [facility]  PHQ-2 Total Score 0    Social History   Tobacco Use  Smoking  Status Every Day   Current packs/day: 1.00   Average packs/day: 1 pack/day for 30.0 years (30.0 ttl pk-yrs)   Types: Cigarettes  Smokeless Tobacco Never  .   Review of Systems  Constitutional: Negative.   HENT: Negative.    Eyes: Negative.   Respiratory: Negative.    Cardiovascular: Negative.   Gastrointestinal: Negative.   Endocrine: Negative.   Genitourinary: Negative.   Musculoskeletal:  Positive for arthralgias.  Skin: Negative.        Moles in the groin area per patient  Neurological: Negative.   Hematological: Negative.   Psychiatric/Behavioral: Negative.       Today's Vitals   11/19/23 0827  BP: 124/78  Pulse: 73  Temp: 99 F (37.2 C)  TempSrc: Oral  Weight: 207 lb (93.9 kg)  Height: 5' 8 (1.727 m)  PainSc: 3   PainLoc: Shoulder   Body mass index is 31.47 kg/m.  Wt Readings from Last 3 Encounters:  11/19/23 207 lb (93.9 kg)  11/04/23 197 lb (89.4 kg)  11/03/23 202 lb 9.6 oz (91.9 kg)    Objective:  Physical Exam Constitutional:      Appearance: Normal appearance.  HENT:     Head: Normocephalic.  Cardiovascular:     Rate and Rhythm: Normal rate and regular rhythm.     Pulses: Normal pulses.     Heart sounds: Normal heart sounds.  Pulmonary:     Effort: Pulmonary effort is normal.     Breath sounds: Normal breath sounds.  Abdominal:     General: Bowel sounds are normal.  Musculoskeletal:        General: Tenderness present.     Comments: Right shoulder   Skin:    General: Skin is warm and dry.  Neurological:     General: No focal deficit present.     Mental Status: He is alert and oriented to person, place, and time. Mental status is at baseline.  Psychiatric:        Mood and Affect: Mood normal.         Assessment And Plan:    Establishing care with new doctor, encounter for  Encounter for general adult medical examination w/o abnormal findings -     CBC -     CMP14+EGFR  Numerous moles Assessment & Plan: Multiple skin moles in  groin and lower abdomen Multiple moles present for years, cosmetically concerning. - Refer to a dermatologist for evaluation and possible removal of moles.  Orders: -     Ambulatory referral to Dermatology  Neck pain Assessment & Plan: Diagnosed with Cervical spondylosis with abnormal vertebral spacing causing arm tingling and numbness. Previous neck x-rays performed.  Orders: -     Ambulatory referral to Orthopedics  Encounter for screening for HIV -     HIV  Antibody (routine testing w rflx)  Encounter for hepatitis C screening test for low risk patient -     Hepatitis C antibody  Need for Tdap vaccination -     Tdap vaccine greater than or equal to 7yo IM  Pneumococcal vaccination declined  Chronic right shoulder pain -     Diclofenac  Sodium; Apply 4 g topically 2 (two) times daily as needed.  Dispense: 20 g; Refill: 1  Herpes zoster vaccination declined  Heavy tobacco smoker Assessment & Plan: Long-term smoking one pack per day for 43 years. Previous quit attempts failed due to stress. Discussed risks of COPD and lung cancer. He agreed to annual chest CT for lung cancer screening.  Orders: -     CT CHEST LUNG CANCER SCREENING LOW DOSE WO CONTRAST; Future  Nocturia -     PSA  Screening for colon cancer -     Ambulatory referral to Gastroenterology  Family history of diabetes mellitus in mother -     Hemoglobin A1c  Routine screening for STI (sexually transmitted infection) -     RPR -     Chlamydia/Gonococcus/Trichomonas, NAA -     HSV 1 and 2 Ab, IgG  Screening for cardiovascular condition -     Lipid panel  Class 1 obesity due to excess calories with body mass index (BMI) of 31.0 to 31.9 in adult, unspecified whether serious comorbidity present Assessment & Plan: He is encouraged to strive for BMI less than 30 to decrease cardiac risk. Advised to aim for at least 150 minutes of exercise per week.     Assessment & Plan Right shoulder pain Chronic pain  likely from past heavy lifting and possible rotator cuff injury. Previous steroid injection provided temporary relief. No MRI, only x-rays done. - Refer to a new orthopedic specialist for further evaluation and management. - Prescribe diclofenac  ointment for topical use on the shoulder as needed.  Chronic right ankle pain, status post internal fixation Chronic pain with three titanium screws. Pain is intermittent and unpredictable, causing episodes of limping.  Cervical spondylosis - Refer to a new orthopedic specialist for further evaluation and management.  Tobacco use disorder - Order annual chest CT scan for lung cancer screening due to long smoking history. - Encourage smoking cessation and discuss potential use of nicotine patches.  Wheezing and chronic cough with phlegm Chronic wheezing and cough, especially at night. No asthma history. Possible COPD due to smoking. - Order chest CT to evaluate for COPD or other lung pathology.     Return for 1 year physical. Patient was given opportunity to ask questions. Patient verbalized understanding of the plan and was able to repeat key elements of the plan. All questions were answered to their satisfaction.   I, Bruna Creighton, NP, have reviewed all documentation for this visit. The documentation on 12/01/2023 for the exam, diagnosis, procedures, and orders are all accurate and complete.

## 2023-11-20 LAB — LIPID PANEL
Chol/HDL Ratio: 5.5 ratio — ABNORMAL HIGH (ref 0.0–5.0)
Cholesterol, Total: 242 mg/dL — ABNORMAL HIGH (ref 100–199)
HDL: 44 mg/dL (ref >39)
LDL Chol Calc (NIH): 185 mg/dL — ABNORMAL HIGH (ref 0–99)
Triglycerides: 76 mg/dL (ref 0–149)
VLDL Cholesterol Cal: 13 mg/dL (ref 5–40)

## 2023-11-20 LAB — CBC
Hematocrit: 45.3 % (ref 37.5–51.0)
Hemoglobin: 15.4 g/dL (ref 13.0–17.7)
MCH: 32 pg (ref 26.6–33.0)
MCHC: 34 g/dL (ref 31.5–35.7)
MCV: 94 fL (ref 79–97)
Platelets: 275 x10E3/uL (ref 150–450)
RBC: 4.82 x10E6/uL (ref 4.14–5.80)
RDW: 13.2 % (ref 11.6–15.4)
WBC: 9.8 x10E3/uL (ref 3.4–10.8)

## 2023-11-20 LAB — CMP14+EGFR
ALT: 25 IU/L (ref 0–44)
AST: 23 IU/L (ref 0–40)
Albumin: 4.5 g/dL (ref 3.8–4.9)
Alkaline Phosphatase: 67 IU/L (ref 44–121)
BUN/Creatinine Ratio: 10 (ref 9–20)
BUN: 12 mg/dL (ref 6–24)
Bilirubin Total: 0.3 mg/dL (ref 0.0–1.2)
CO2: 19 mmol/L — ABNORMAL LOW (ref 20–29)
Calcium: 9.8 mg/dL (ref 8.7–10.2)
Chloride: 104 mmol/L (ref 96–106)
Creatinine, Ser: 1.17 mg/dL (ref 0.76–1.27)
Globulin, Total: 2.4 g/dL (ref 1.5–4.5)
Glucose: 91 mg/dL (ref 70–99)
Potassium: 4.7 mmol/L (ref 3.5–5.2)
Sodium: 140 mmol/L (ref 134–144)
Total Protein: 6.9 g/dL (ref 6.0–8.5)
eGFR: 74 mL/min/1.73 (ref >59)

## 2023-11-20 LAB — HIV ANTIBODY (ROUTINE TESTING W REFLEX): HIV Screen 4th Generation wRfx: NONREACTIVE

## 2023-11-20 LAB — PSA: Prostate Specific Ag, Serum: 0.4 ng/mL (ref 0.0–4.0)

## 2023-11-20 LAB — RPR: RPR Ser Ql: NONREACTIVE

## 2023-11-20 LAB — HEMOGLOBIN A1C
Est. average glucose Bld gHb Est-mCnc: 126 mg/dL
Hgb A1c MFr Bld: 6 % — ABNORMAL HIGH (ref 4.8–5.6)

## 2023-11-20 LAB — HEPATITIS C ANTIBODY: Hep C Virus Ab: NONREACTIVE

## 2023-11-20 LAB — HSV 1 AND 2 AB, IGG
HSV 1 Glycoprotein G Ab, IgG: NONREACTIVE
HSV 2 IgG, Type Spec: REACTIVE — AB

## 2023-11-22 LAB — CHLAMYDIA/GONOCOCCUS/TRICHOMONAS, NAA
Chlamydia by NAA: NEGATIVE
Gonococcus by NAA: NEGATIVE
Trich vag by NAA: NEGATIVE

## 2023-12-01 ENCOUNTER — Ambulatory Visit: Payer: Self-pay | Admitting: Family Medicine

## 2023-12-01 DIAGNOSIS — Z136 Encounter for screening for cardiovascular disorders: Secondary | ICD-10-CM | POA: Insufficient documentation

## 2023-12-01 DIAGNOSIS — Z113 Encounter for screening for infections with a predominantly sexual mode of transmission: Secondary | ICD-10-CM | POA: Insufficient documentation

## 2023-12-01 MED ORDER — ATORVASTATIN CALCIUM 20 MG PO TABS: 20.0000 mg | ORAL_TABLET | Freq: Every day | ORAL | 2 refills | Status: AC

## 2023-12-01 NOTE — Progress Notes (Signed)
 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 It cannot determine. A1c 6.0 meaning diabetic, low carb diet advised, cholesterol levels are elevated will send atorvastatin  20 mg every day to the pharmacy.  Prostrate test, kidney function test are also normal, follow up in 6 months to recheck A1c

## 2023-12-01 NOTE — Assessment & Plan Note (Signed)
 He is encouraged to strive for BMI less than 30 to decrease cardiac risk. Advised to aim for at least 150 minutes of exercise per week.

## 2023-12-01 NOTE — Assessment & Plan Note (Signed)
 Diagnosed with Cervical spondylosis with abnormal vertebral spacing causing arm tingling and numbness. Previous neck x-rays performed.

## 2023-12-01 NOTE — Assessment & Plan Note (Signed)
 Long-term smoking one pack per day for 43 years. Previous quit attempts failed due to stress. Discussed risks of COPD and lung cancer. He agreed to annual chest CT for lung cancer screening.

## 2023-12-01 NOTE — Assessment & Plan Note (Signed)
 Multiple skin moles in groin and lower abdomen Multiple moles present for years, cosmetically concerning. - Refer to a dermatologist for evaluation and possible removal of moles.

## 2024-01-28 ENCOUNTER — Emergency Department (HOSPITAL_COMMUNITY)
Admission: EM | Admit: 2024-01-28 | Discharge: 2024-01-28 | Disposition: A | Discharge status: Home / Self Care | Attending: Emergency Medicine | Admitting: Emergency Medicine

## 2024-01-28 ENCOUNTER — Emergency Department (HOSPITAL_COMMUNITY)

## 2024-01-28 ENCOUNTER — Other Ambulatory Visit: Payer: Self-pay

## 2024-01-28 ENCOUNTER — Encounter (HOSPITAL_COMMUNITY): Payer: Self-pay

## 2024-01-28 DIAGNOSIS — I10 Essential (primary) hypertension: Secondary | ICD-10-CM | POA: Diagnosis not present

## 2024-01-28 DIAGNOSIS — R10A2 Flank pain, left side: Secondary | ICD-10-CM | POA: Insufficient documentation

## 2024-01-28 LAB — URINALYSIS, ROUTINE W REFLEX MICROSCOPIC
Bilirubin Urine: NEGATIVE
Glucose, UA: NEGATIVE mg/dL
Hgb urine dipstick: NEGATIVE
Ketones, ur: NEGATIVE mg/dL
Leukocytes,Ua: NEGATIVE
Nitrite: NEGATIVE
Protein, ur: NEGATIVE mg/dL
Specific Gravity, Urine: 1.018 (ref 1.005–1.030)
pH: 5 (ref 5.0–8.0)

## 2024-01-28 MED ORDER — PREDNISONE 20 MG PO TABS: 40.0000 mg | ORAL_TABLET | Freq: Every day | ORAL | 0 refills | Status: AC

## 2024-01-28 MED ORDER — KETOROLAC TROMETHAMINE 15 MG/ML IJ SOLN
15.0000 mg | Freq: Once | INTRAMUSCULAR | Status: AC
  Administered 2024-01-28: 15 mg via INTRAMUSCULAR
  Filled 2024-01-28: qty 1

## 2024-01-28 MED ORDER — METHOCARBAMOL 500 MG PO TABS: 500.0000 mg | ORAL_TABLET | Freq: Three times a day (TID) | ORAL | 0 refills | Status: AC | PRN

## 2024-01-28 NOTE — ED Notes (Signed)
 Pt provided with urine cup and informed we need a sample. Pt unable to urinate at this time

## 2024-01-28 NOTE — Discharge Instructions (Signed)
 The CT of your abdomen pelvis was reassuring.  The screening CT of your chest had not been resulted yet can be followed by your PCP.  Take the new medicines to help with the pain.  Follow-up with your doctor as needed.

## 2024-01-28 NOTE — ED Triage Notes (Signed)
 C/O left side pain for a month and states pain is more intense. Denies n/v/urinary issues.

## 2024-01-28 NOTE — ED Triage Notes (Signed)
 Patient states left pain has been hurting for a month and it is worse. Its mainly above left hip and has been getting worse and it not progressed to shooting pains. Patient states it is a constant pain. Denies injury.

## 2024-01-30 NOTE — ED Provider Notes (Signed)
 [facility] AT [facility] Provider Note   CSN: [id number] Arrival date & time: 01/28/24  1122     Patient presents with: Flank Pain   Walter Collins is a 53 y.o. male.  {Add pertinent medical, surgical, social history, OB history to HPI:32947}  Flank Pain  Patient with left-sided flank pain.  Has had for around a month.  Worse with certain movements.  No nausea or vomiting.  No urinary issues.  No fevers.  Not changed with bowel movements.    Past Medical History:  Diagnosis Date   Hypertension    'when I was little'    Prior to Admission medications   Medication Sig Start Date End Date Taking? Authorizing Provider  methocarbamol (ROBAXIN) 500 MG tablet Take 1 tablet (500 mg total) by mouth every 8 (eight) hours as needed. 01/28/24  Yes Patsey Lot, MD  predniSONE (DELTASONE) 20 MG tablet Take 2 tablets (40 mg total) by mouth daily for 3 days. 01/28/24 01/31/24 Yes Patsey Lot, MD  atorvastatin  (LIPITOR) 20 MG tablet Take 1 tablet (20 mg total) by mouth daily. 12/01/23 11/30/24  Petrina Pries, NP  diclofenac  Sodium (VOLTAREN ) 1 % GEL Apply 4 g topically 2 (two) times daily as needed. 11/19/23   Petrina Pries, NP  hydrOXYzine  (ATARAX ) 50 MG tablet Take 1 tablet (50 mg total) by mouth 3 (three) times daily as needed for anxiety. Patient not taking: Reported on 11/19/2023 11/04/23   Sebastian Beverley NOVAK, MD  permethrin  (ELIMITE ) 5 % cream Thoroughly massage cream from head to soles of feet; leave on for 8 to 14 hours before removing (shower or bath). 10/31/23   Christopher Savannah, PA-C  tamsulosin  (FLOMAX ) 0.4 MG CAPS capsule Take 1 capsule (0.4 mg total) by mouth daily. Patient not taking: Reported on 11/19/2023 10/30/20   Claudene Ashley SAUNDERS, NP  triamcinolone  ointment (KENALOG ) 0.5 % Apply 1 Application topically 2 (two) times daily. For moderate to severe eczema.  Do not use for more than 1 week at a time. Patient not taking: Reported on 11/19/2023 11/04/23    Sebastian Beverley NOVAK, MD    Allergies: Bee venom    Review of Systems  Genitourinary:  Positive for flank pain.    Updated Vital Signs BP (!) 151/93 (BP Location: Right Arm)   Pulse 70   Temp 98.1 F (36.7 C) (Oral)   Resp 16   Ht 5' 8 (1.727 m)   Wt 88.5 kg   SpO2 99%   BMI 29.65 kg/m   Physical Exam Vitals and nursing note reviewed.  Abdominal:     Tenderness: There is abdominal tenderness.     Comments: Left side abdominal tenderness rebound or guarding.  No hernia palpated.  Pain also somewhat left posterior.  Genitourinary:    Comments: Left-sided lower flank tenderness. Skin:    General: Skin is warm.  Neurological:     Mental Status: He is alert.     (all labs ordered are listed, but only abnormal results are displayed) Labs Reviewed  URINALYSIS, ROUTINE W REFLEX MICROSCOPIC    EKG: None  Radiology: No results found.  {Document cardiac monitor, telemetry assessment procedure when appropriate:32947} Procedures   Medications Ordered in the ED  ketorolac (TORADOL) 15 MG/ML injection 15 mg (15 mg Intramuscular Given 01/28/24 1335)      {Click here for ABCD2, HEART and other calculators REFRESH Note before signing:1}  Medical Decision Making Amount and/or Complexity of Data Reviewed Labs: ordered. Radiology: ordered.  Risk Prescription drug management.   Patient with left-sided flank pain.  Somewhat reproducible.  Has had for a month.  No urinary issues.  CT scan done and reassuring.  No stone.  CT scan of the chest done for lung cancer screening.  Had not resulted at time of discharge can be followed by PCP that ordered.  Done for screening purposes since she had not had it done.  Later results reviewed and did show some nodules will need following.  Have sent message to PCP  {Document critical care time when appropriate  Document review of labs and clinical decision tools ie CHADS2VASC2, etc  Document your  independent review of radiology images and any outside records  Document your discussion with family members, caretakers and with consultants  Document social determinants of health affecting pt's care  Document your decision making why or why not admission, treatments were needed:32947:::1}   Final diagnoses:  Left flank pain    ED Discharge Orders          Ordered    predniSONE (DELTASONE) 20 MG tablet  Daily        01/28/24 1535    methocarbamol (ROBAXIN) 500 MG tablet  Every 8 hours PRN        01/28/24 1535

## 2024-02-03 ENCOUNTER — Telehealth: Payer: Self-pay

## 2024-02-03 NOTE — Telephone Encounter (Signed)
 I called and left pt vm to call the office so we can schedule him for a ER f/u. Walter Collins

## 2024-02-04 ENCOUNTER — Telehealth: Payer: Self-pay

## 2024-02-04 NOTE — Telephone Encounter (Signed)
 2nd attempt to contact patient. I have left him a vm to call the office to schedule an appt. YL,RMA

## 2024-02-18 ENCOUNTER — Encounter: Payer: Self-pay | Admitting: Family Medicine
